# Patient Record
Sex: Female | Born: 1940 | Race: White | Hispanic: No | Marital: Married | Smoking: Former smoker
Health system: Southern US, Community
[De-identification: ages and names within clinical notes are randomized; demographics above are authoritative.]

## PROBLEM LIST (undated history)

## (undated) DIAGNOSIS — N200 Calculus of kidney: Secondary | ICD-10-CM

## (undated) DIAGNOSIS — R7303 Prediabetes: Secondary | ICD-10-CM

## (undated) DIAGNOSIS — I10 Essential (primary) hypertension: Secondary | ICD-10-CM

## (undated) DIAGNOSIS — Z87442 Personal history of urinary calculi: Secondary | ICD-10-CM

---

## 2003-03-21 ENCOUNTER — Ambulatory Visit (HOSPITAL_COMMUNITY): Admission: RE | Admit: 2003-03-21 | Discharge: 2003-03-21 | Payer: Self-pay | Admitting: Internal Medicine

## 2003-03-21 ENCOUNTER — Encounter: Payer: Self-pay | Admitting: Internal Medicine

## 2007-07-16 ENCOUNTER — Emergency Department (HOSPITAL_COMMUNITY): Admission: EM | Admit: 2007-07-16 | Discharge: 2007-07-17 | Payer: Self-pay | Admitting: Emergency Medicine

## 2008-04-14 ENCOUNTER — Emergency Department (HOSPITAL_COMMUNITY): Admission: EM | Admit: 2008-04-14 | Discharge: 2008-04-14 | Payer: Self-pay | Admitting: Emergency Medicine

## 2008-04-19 ENCOUNTER — Ambulatory Visit (HOSPITAL_COMMUNITY): Admission: RE | Admit: 2008-04-19 | Discharge: 2008-04-19 | Payer: Self-pay | Admitting: Urology

## 2009-09-04 ENCOUNTER — Ambulatory Visit (HOSPITAL_COMMUNITY): Admission: RE | Admit: 2009-09-04 | Discharge: 2009-09-04 | Payer: Self-pay | Admitting: Internal Medicine

## 2009-10-02 ENCOUNTER — Telehealth (INDEPENDENT_AMBULATORY_CARE_PROVIDER_SITE_OTHER): Payer: Self-pay

## 2009-10-02 ENCOUNTER — Encounter: Payer: Self-pay | Admitting: Internal Medicine

## 2009-10-09 ENCOUNTER — Ambulatory Visit (HOSPITAL_COMMUNITY): Admission: RE | Admit: 2009-10-09 | Discharge: 2009-10-09 | Payer: Self-pay | Admitting: Internal Medicine

## 2009-10-09 ENCOUNTER — Ambulatory Visit: Payer: Self-pay | Admitting: Internal Medicine

## 2010-08-20 NOTE — Letter (Signed)
Summary: Internal Other Domingo Dimes  Internal Other Domingo Dimes   Imported By: Cloria Spring LPN 54/03/8118 14:78:29  _____________________________________________________________________  External Attachment:    Type:   Image     Comment:   External Document

## 2010-08-20 NOTE — Progress Notes (Signed)
Summary: Reviewed instructions for TCS with pt  Phone Note Call from Patient   Caller: Patient Summary of Call: Pt called and i reviewed instructions for her TCS. Rx and instructions were faxed to [facility] on 10/01/2009. Initial call taken by: Cloria Spring LPN,  October 02, 2009 4:53 PM

## 2011-04-21 LAB — URINALYSIS, ROUTINE W REFLEX MICROSCOPIC
Glucose, UA: NEGATIVE
Leukocytes, UA: NEGATIVE
Nitrite: NEGATIVE
Urobilinogen, UA: 0.2

## 2011-04-21 LAB — POCT I-STAT, CHEM 8
BUN: 18
Calcium, Ion: 1.17
Chloride: 106
Creatinine, Ser: 1
Glucose, Bld: 142 — ABNORMAL HIGH
TCO2: 25

## 2011-04-21 LAB — URINE MICROSCOPIC-ADD ON

## 2011-04-25 LAB — BASIC METABOLIC PANEL
BUN: 10
CO2: 26
Calcium: 9.5
Chloride: 105
Creatinine, Ser: 0.88
Glucose, Bld: 141 — ABNORMAL HIGH

## 2011-04-25 LAB — DIFFERENTIAL
Basophils Absolute: 0
Basophils Relative: 0
Eosinophils Absolute: 0.1
Monocytes Relative: 10
Neutro Abs: 5.6
Neutrophils Relative %: 70

## 2011-04-25 LAB — CBC
MCHC: 33.1
MCV: 86.5
RDW: 13.4

## 2011-05-13 ENCOUNTER — Other Ambulatory Visit: Payer: Self-pay

## 2011-05-13 ENCOUNTER — Emergency Department (HOSPITAL_COMMUNITY)
Admission: EM | Admit: 2011-05-13 | Discharge: 2011-05-13 | Disposition: A | Discharge status: Other Acute Inpatient Hospital | Payer: Medicare Other | Source: Home / Self Care | Attending: Emergency Medicine | Admitting: Emergency Medicine

## 2011-05-13 ENCOUNTER — Emergency Department (HOSPITAL_COMMUNITY): Payer: Medicare Other

## 2011-05-13 ENCOUNTER — Inpatient Hospital Stay (HOSPITAL_COMMUNITY)
Admission: EM | Admit: 2011-05-13 | Discharge: 2011-05-14 | DRG: 313 | Disposition: A | Discharge status: Home Health | Payer: Medicare Other | Source: Other Acute Inpatient Hospital | Attending: Cardiology | Admitting: Cardiology

## 2011-05-13 ENCOUNTER — Encounter: Payer: Self-pay | Admitting: *Deleted

## 2011-05-13 DIAGNOSIS — R0789 Other chest pain: Principal | ICD-10-CM | POA: Diagnosis present

## 2011-05-13 DIAGNOSIS — R079 Chest pain, unspecified: Secondary | ICD-10-CM

## 2011-05-13 DIAGNOSIS — K5730 Diverticulosis of large intestine without perforation or abscess without bleeding: Secondary | ICD-10-CM | POA: Diagnosis present

## 2011-05-13 DIAGNOSIS — Z87891 Personal history of nicotine dependence: Secondary | ICD-10-CM

## 2011-05-13 DIAGNOSIS — E785 Hyperlipidemia, unspecified: Secondary | ICD-10-CM | POA: Diagnosis present

## 2011-05-13 DIAGNOSIS — I200 Unstable angina: Secondary | ICD-10-CM

## 2011-05-13 DIAGNOSIS — Z7982 Long term (current) use of aspirin: Secondary | ICD-10-CM

## 2011-05-13 DIAGNOSIS — M25519 Pain in unspecified shoulder: Secondary | ICD-10-CM | POA: Diagnosis present

## 2011-05-13 DIAGNOSIS — K219 Gastro-esophageal reflux disease without esophagitis: Secondary | ICD-10-CM | POA: Diagnosis present

## 2011-05-13 DIAGNOSIS — J9819 Other pulmonary collapse: Secondary | ICD-10-CM | POA: Diagnosis present

## 2011-05-13 DIAGNOSIS — Z79899 Other long term (current) drug therapy: Secondary | ICD-10-CM

## 2011-05-13 DIAGNOSIS — E7800 Pure hypercholesterolemia, unspecified: Secondary | ICD-10-CM | POA: Diagnosis present

## 2011-05-13 DIAGNOSIS — R7309 Other abnormal glucose: Secondary | ICD-10-CM | POA: Diagnosis present

## 2011-05-13 DIAGNOSIS — I10 Essential (primary) hypertension: Secondary | ICD-10-CM | POA: Diagnosis present

## 2011-05-13 HISTORY — DX: Essential (primary) hypertension: I10

## 2011-05-13 HISTORY — DX: Calculus of kidney: N20.0

## 2011-05-13 LAB — GLUCOSE, CAPILLARY
Glucose-Capillary: 130 mg/dL — ABNORMAL HIGH (ref 70–99)
Glucose-Capillary: 133 mg/dL — ABNORMAL HIGH (ref 70–99)
Glucose-Capillary: 107 mg/dL — ABNORMAL HIGH (ref 70–99)

## 2011-05-13 LAB — CARDIAC PANEL(CRET KIN+CKTOT+MB+TROPI)
CK, MB: 3 ng/mL (ref 0.3–4.0)
CK, MB: 3 ng/mL (ref 0.3–4.0)
Relative Index: 2.5 (ref 0.0–2.5)
Relative Index: 2.7 — ABNORMAL HIGH (ref 0.0–2.5)
Relative Index: 2.2 (ref 0.0–2.5)
Total CK: 118 U/L (ref 7–177)
Total CK: 113 U/L (ref 7–177)
Total CK: 194 U/L — ABNORMAL HIGH (ref 7–177)
Troponin I: <0.3 ng/mL (ref <0.30)
Troponin I: <0.3 ng/mL (ref <0.30)

## 2011-05-13 LAB — LIPID PANEL
Cholesterol: 255 mg/dL — ABNORMAL HIGH (ref 0–200)
VLDL: 17 mg/dL (ref 0–40)

## 2011-05-13 LAB — CBC
Hemoglobin: 13.5 g/dL (ref 12.0–15.0)
Hemoglobin: 12.1 g/dL (ref 12.0–15.0)
MCH: 29.4 pg (ref 26.0–34.0)
MCH: 29.4 pg (ref 26.0–34.0)
MCHC: 34 g/dL (ref 30.0–36.0)
MCHC: 34.7 g/dL (ref 30.0–36.0)
Platelets: 245 10*3/uL (ref 150–400)
Platelets: 213 10*3/uL (ref 150–400)
RBC: 4.59 MIL/uL (ref 3.87–5.11)

## 2011-05-13 LAB — POCT I-STAT, CHEM 8
Creatinine, Ser: 1 mg/dL (ref 0.50–1.10)
Glucose, Bld: 186 mg/dL — ABNORMAL HIGH (ref 70–99)
HCT: 42 % (ref 36.0–46.0)
Hemoglobin: 14.3 g/dL (ref 12.0–15.0)
Potassium: 3.8 mEq/L (ref 3.5–5.1)
Sodium: 133 mEq/L — ABNORMAL LOW (ref 135–145)
TCO2: 24 mmol/L (ref 0–100)

## 2011-05-13 LAB — BASIC METABOLIC PANEL
BUN: 16 mg/dL (ref 6–23)
Chloride: 95 mEq/L — ABNORMAL LOW (ref 96–112)
GFR calc Af Amer: 72 mL/min — ABNORMAL LOW (ref >90)
GFR calc non Af Amer: 62 mL/min — ABNORMAL LOW (ref >90)
Potassium: 3.7 mEq/L (ref 3.5–5.1)
Sodium: 132 mEq/L — ABNORMAL LOW (ref 135–145)

## 2011-05-13 LAB — DIFFERENTIAL
Basophils Relative: 0 % (ref 0–1)
Eosinophils Absolute: 0.1 10*3/uL (ref 0.0–0.7)
Lymphs Abs: 1.3 10*3/uL (ref 0.7–4.0)
Monocytes Relative: 6 % (ref 3–12)
Neutro Abs: 9.2 10*3/uL — ABNORMAL HIGH (ref 1.7–7.7)
Neutrophils Relative %: 82 % — ABNORMAL HIGH (ref 43–77)

## 2011-05-13 LAB — MRSA PCR SCREENING: MRSA by PCR: NEGATIVE

## 2011-05-13 LAB — TSH: TSH: 0.818 u[IU]/mL (ref 0.350–4.500)

## 2011-05-13 LAB — HEMOGLOBIN A1C
Hgb A1c MFr Bld: 6.2 % — ABNORMAL HIGH (ref <5.7)
Mean Plasma Glucose: 131 mg/dL — ABNORMAL HIGH (ref <117)

## 2011-05-13 LAB — POCT I-STAT TROPONIN I: Troponin i, poc: 0 ng/mL (ref 0.00–0.08)

## 2011-05-13 MED ORDER — ONDANSETRON HCL 4 MG/2ML IJ SOLN
4.0000 mg | Freq: Once | INTRAMUSCULAR | Status: AC
  Administered 2011-05-13: 4 mg via INTRAVENOUS
  Filled 2011-05-13: qty 2

## 2011-05-13 MED ORDER — HEPARIN BOLUS VIA INFUSION
5000.0000 [IU] | Freq: Once | INTRAVENOUS | Status: AC
  Administered 2011-05-13: 5000 [IU] via INTRAVENOUS
  Filled 2011-05-13: qty 5000

## 2011-05-13 MED ORDER — ASPIRIN 81 MG PO CHEW
324.0000 mg | CHEWABLE_TABLET | Freq: Once | ORAL | Status: AC
  Administered 2011-05-13: 324 mg via ORAL
  Filled 2011-05-13: qty 4

## 2011-05-13 MED ORDER — SODIUM CHLORIDE 0.9 % IV SOLN
Freq: Once | INTRAVENOUS | Status: AC
  Administered 2011-05-13: 03:00:00 via INTRAVENOUS

## 2011-05-13 MED ORDER — HEPARIN (PORCINE) IN NACL 100-0.45 UNIT/ML-% IJ SOLN
12.0000 [IU]/kg/h | INTRAMUSCULAR | Status: DC
  Administered 2011-05-13: 12 [IU]/kg/h via INTRAVENOUS
  Filled 2011-05-13: qty 250

## 2011-05-13 MED ORDER — NITROGLYCERIN IN D5W 200-5 MCG/ML-% IV SOLN
10.0000 ug/min | Freq: Once | INTRAVENOUS | Status: AC
  Administered 2011-05-13: 10 ug/min via INTRAVENOUS
  Filled 2011-05-13: qty 250

## 2011-05-13 NOTE — H&P (Signed)
[name], [name]               ACCOUNT NO.:  [id number]  MEDICAL RECORD NO.:  [id number]  LOCATION:  2608                         FACILITY:  [facility]  PHYSICIAN:  Zacarias Pontes, MD       DATE OF BIRTH:  04/30/1941  DATE OF ADMISSION:  05/13/2011 DATE OF DISCHARGE:                             HISTORY & PHYSICAL   PRIMARY CARDIOLOGIST:  None established.  CHIEF COMPLAINT:  Chest pain.  HISTORY OF PRESENT ILLNESS:  The patient is a pleasant 70 year old woman with a history of hypertension, hyperlipidemia, and potentially undiagnosed diabetes mellitus who presents with an approximate 12 hour history of central chest tightness, which began at rest while she was applying a heating pad to her sore lower back.  Her primary discomfort beginning yesterday morning was lower back and left shoulder pain, both of which are chronic.  In attempting to obtain some degree of pain relief for her back, she sat on a heating pad and it was during this time that she developed central chest pressure.  The pressure was non-pleurtic and non-radiating.  She had no associated diaphoresis or nausea. No PND or orthopnea.  No cough or overt shortness of breath.  The chest tightness did not improve on its own and as a result, she became concerned and decided to seek medical attention for further evaluation and management.  The patient has not have any known coronary artery disease history and denies having experienced these particular symptoms in the past.  She has, otherwise, felt well of late with no fevers and no sick contacts. No palpitations and no syncope.  PAST MEDICAL HISTORY: 1. Diverticulosis. 2. Nephrolithiasis. 3. Hypertension. 4. Hyperlipidemia. 5. Possible diabetes given her random glucose. 6. GERD.  SOCIAL HISTORY:  She is a current nonsmoker with a very remote smoking history.  She does not drink alcohol, nor does she abuse drugs.  She lives with her husband.  FAMILY HISTORY:   Positive for coronary artery disease in her mother.  REVIEW OF SYSTEMS:  As per HPI, otherwise, is comprehensively negative.  ALLERGIES:  No known drug allergies.  MEDICATIONS: 1. Enalapril 10 mg p.o. b.i.d. 2. Fish oil daily.  PHYSICAL EXAMINATION:  VITAL SIGNS:  The patient has a heart rate of 75 and a blood pressure of 138/86 with a respiratory rate of 16, and she is satting 96% on 2 L nasal cannula. GENERAL:  She is in no acute distress, and is a bit groggy from having been up for most of the night.  She, otherwise is not using any accessory muscles and an assistance of her breathing. HEENT:  Notable for a supple neck with no masses or lymphadenopathy. Her JVP is not appreciably elevated.  CARDIAC:  Notable for a normal S1 and S2 with a 2/6 raspy early systolic ejection murmur, heard best at the right upper sternal border.  No rubs or gallops are appreciated. LUNGS:  Clear to auscultation bilaterally with no crackles or wheezing appreciated. ABDOMEN:  Soft, nontender, nondistended with positive bowel sounds and no abdominal bruits appreciated.  EXTREMITIES:  Warm and well perfused with no lower extremity edema. NEUROLOGIC:  She is alert and oriented x3 with no focal neurologic  deficits detected.  LABORATORY EVALUATION:  White count is 11, her hematocrit is 39, her platelets are 245,000.  Sodium 132, potassium 3.7, chloride 95, bicarb 27, BUN 16, creatinine 0.9, with a random glucose of 182.  Of note, initial set of cardiac enzymes were sent the outside hospital with a CK of 118, CK-MB of 3 and a troponin of less than 0.3.  Her ECG demonstrates normal sinus rhythm, with no more than half a mm of ST depression in leads 2, 3 and aVF.  No ST elevation is present anywhere on the electrocardiogram.  IMPRESSION:  This is a 70 year old woman with a history of hypertension, hyperlipidemia and potential diabetes mellitus who presents with acute onset chest pain, concerning for the  possibility of unstable angina. Alternatively, it is possible that she has referred pain from her chronic lower back discomfort, but we are nevertheless obligated to trend her enzymes and complete on appropriate coronary evaluation.  PLANS:  We will admit her to the step-down unit on IV infusions of heparin and nitroglycerin.  We will titrate her nitroglycerin to a systolic blood pressure goal of approximately 120 and/or total pain relief.  We will continue to trend her cardiac biomarkers over the course of the next 12-18 hours.  She will be on a full dose aspirin, and we will continue her ACE inhibitor for blood pressure control.  Should her ECGs and biomarkers remain reassuring together with the absence of symptoms going forward,  it would then make sense to pursue noninvasive stress testing to evaluate for inducible ischemic regions of myocardium.  If, however, her symptoms and/or ECGs and/or labs evolve in a suggestive manner, we will then plan on more invasive testing via coronary angiogram.  The plan was discussed with the patient who voiced understanding.  Her questions were answered to the best of my ability.          ______________________________ Zacarias Pontes, MD     DM/MEDQ  D:  05/13/2011  T:  05/13/2011  Job:  [id number]  Electronically Signed by Zacarias Pontes MD on 05/13/2011 09:13:24 AM

## 2011-05-13 NOTE — ED Notes (Signed)
Patient c/o nausea; order received for additional Zofran.

## 2011-05-13 NOTE — ED Notes (Signed)
Patient c/o chest pain; states feels like a stuffiness.

## 2011-05-13 NOTE — ED Notes (Signed)
Patient c/o chest pain that started approximately 3 hours ago. Patient states she was having back pain and she was sitting with a heating pad and developed a "congestiony" feeling in her mid-chest.  Patient rates pain as 5/10.  Patient with shortness of breath noted.

## 2011-05-13 NOTE — ED Notes (Signed)
Pt reports onset of substernal chest pain starting approx 3 hs ago, pt reports she took something for acid reflux without relief

## 2011-05-13 NOTE — ED Provider Notes (Signed)
History     CSN: [id number] Arrival date & time: 05/13/2011 12:55 AM   First MD Initiated Contact with Patient 05/13/11 0100      Chief Complaint  Patient presents with  . Chest Pain    (Consider location/radiation/quality/duration/timing/severity/associated sxs/prior treatment) HPI Comments: Is a 70 year old female with a history of hypertension and high cholesterol presents with substernal chest tightness which started approximately 6 hours prior to arrival. She states that for the last 24 hours she has had some discomfort in her left shoulder. This became much worse tonight was associated with shortness of breath and nausea andshe vomited when trying to take her reflux medications. She denies a history of cardiac disease has not smoked in over 40 years and has a family history significant for mother that died of a possible heart attack and a father who died from tuberculosis.  Symptoms are constant, fluctuating in intensity but not related to position, deep breathing, palpation, walking.  Patient is a 70 y.o. female presenting with chest pain. The history is provided by the patient and medical records.  Chest Pain     Past Medical History  Diagnosis Date  . Hypertension   . Kidney calculi     Past Surgical History  Procedure Date  . Cesarean section     No family history on file.  History  Substance Use Topics  . Smoking status: Never Smoker   . Smokeless tobacco: Not on file  . Alcohol Use: No    OB History    Grav Para Term Preterm Abortions TAB SAB Ect Mult Living                  Review of Systems  Cardiovascular: Positive for chest pain.  All other systems reviewed and are negative.    Allergies  Review of patient's allergies indicates no known allergies.  Home Medications   Current Outpatient Rx  Name Route Sig Dispense Refill  . ENALAPRIL MALEATE PO Oral Take by mouth 2 (two) times daily.      . OMEGA-3 FATTY ACIDS 1000 MG PO CAPS Oral Take 2 g  by mouth 2 (two) times daily.        BP 179/101  Pulse 97  Resp 16  Ht 5\' 10"  (1.778 m)  Wt 175 lb (79.379 kg)  BMI 25.11 kg/m2  SpO2 96%  Physical Exam  Nursing note and vitals reviewed. Constitutional: She appears well-developed and well-nourished. No distress.  HENT:  Head: Normocephalic and atraumatic.  Mouth/Throat: Oropharynx is clear and moist. No oropharyngeal exudate.  Eyes: Conjunctivae and EOM are normal. Pupils are equal, round, and reactive to light. Right eye exhibits no discharge. Left eye exhibits no discharge. No scleral icterus.  Neck: Normal range of motion. Neck supple. No JVD present. No thyromegaly present.  Cardiovascular: Normal rate, regular rhythm, normal heart sounds and intact distal pulses.  Exam reveals no gallop and no friction rub.   No murmur heard. Pulmonary/Chest: Effort normal and breath sounds normal. No respiratory distress. She has no wheezes. She has no rales.  Abdominal: Soft. Bowel sounds are normal. She exhibits no distension and no mass. There is no tenderness.  Musculoskeletal: Normal range of motion. She exhibits no edema and no tenderness.  Lymphadenopathy:    She has no cervical adenopathy.  Neurological: She is alert. Coordination normal.  Skin: Skin is warm and dry. No rash noted. No erythema.  Psychiatric: She has a normal mood and affect. Her behavior is normal.  ED Course  Procedures (including critical care time)   Labs Reviewed  CBC  DIFFERENTIAL  BASIC METABOLIC PANEL  CARDIAC PANEL(CRET KIN+CKTOT+MB+TROPI)  I-STAT TROPONIN I  I-STAT, CHEM 8  APTT  PROTIME-INR   No results found.   No diagnosis found.    MDM  Patient has significant hypertension at 179/101, otherwise no tachycardia or respiratory distress. Oxygen levels are 96% on room air. Her EKG does reveal diffuse ST abnormalities with slight depression but no ST elevation. She will require cardiac workup and anticipate admission for a rule out given  her ongoing symptoms. Blood work, chest x-ray ordered.  Initiate nitro drip for ongoing pain    ED ECG REPORT   Date: 05/13/2011   Rate: 100  Rhythm: normal sinus rhythm  QRS Axis: normal  Intervals: normal  ST/T Wave abnormalities: nonspecific ST changes  Conduction Disutrbances:first-degree A-V block   Narrative Interpretation:   Old EKG Reviewed: none available   ED ECG REPORT   Date: 05/13/2011 2:26  Rate:96   Rhythm: normal sinus rhythm  QRS Axis: normal  Intervals: normal  ST/T Wave abnormalities: minimal ST dep   Conduction Disutrbances: first deg AV block  Narrative Interpretation:   Old EKG Reviewed: unchanged  Discussed with cardiologist on call at [facility] hospital who has accepted patient to a step down bed. I started the patient on a nitroglycerin drip as well as a heparin infusion related to her likely unstable angina. Patient is having ongoing chest pain, intermittent nausea but blood pressure has improved on a nitroglycerin drip. Critical care provided  CRITICAL CARE Performed by: Vida Roller   Total critical care time: 35   Critical care time was exclusive of separately billable procedures and treating other patients.  Critical care was necessary to treat or prevent imminent or life-threatening deterioration.  Critical care was time spent personally by me on the following activities: development of treatment plan with patient and/or surrogate as well as nursing, discussions with consultants, evaluation of patient's response to treatment, examination of patient, obtaining history from patient or surrogate, ordering and performing treatments and interventions, ordering and review of laboratory studies, ordering and review of radiographic studies, pulse oximetry and re-evaluation of patient's condition.    Vida Roller, MD 05/13/11 [phone]

## 2011-05-13 NOTE — ED Notes (Signed)
Patient c/o nausea, patient dry heaving into emesis bag.  Dr. Hyacinth Meeker aware, order received for Zofran.

## 2011-05-13 NOTE — ED Notes (Signed)
Patient states her chest pain is coming back.  Continues to have intermittent chest pain that she rates between 5-8/10.

## 2011-05-14 ENCOUNTER — Inpatient Hospital Stay (HOSPITAL_COMMUNITY): Payer: Medicare Other

## 2011-05-14 DIAGNOSIS — R079 Chest pain, unspecified: Secondary | ICD-10-CM

## 2011-05-14 LAB — CBC
HCT: 37.7 % (ref 36.0–46.0)
Hemoglobin: 12.8 g/dL (ref 12.0–15.0)
MCV: 86.9 fL (ref 78.0–100.0)
RBC: 4.34 MIL/uL (ref 3.87–5.11)
RDW: 13.2 % (ref 11.5–15.5)
WBC: 5.9 10*3/uL (ref 4.0–10.5)

## 2011-05-14 MED ORDER — TECHNETIUM TC 99M TETROFOSMIN IV KIT
10.0000 | PACK | Freq: Once | INTRAVENOUS | Status: AC | PRN
  Administered 2011-05-14: 10 via INTRAVENOUS

## 2011-05-14 MED ORDER — TECHNETIUM TC 99M TETROFOSMIN IV KIT
30.0000 | PACK | Freq: Once | INTRAVENOUS | Status: AC | PRN
  Administered 2011-05-14: 30 via INTRAVENOUS

## 2011-05-15 LAB — GLUCOSE, CAPILLARY
Glucose-Capillary: 115 mg/dL — ABNORMAL HIGH (ref 70–99)
Glucose-Capillary: 101 mg/dL — ABNORMAL HIGH (ref 70–99)

## 2011-05-23 NOTE — Discharge Summary (Signed)
[name], LAWYER               ACCOUNT NO.:  [id number]  MEDICAL RECORD NO.:  [id number]  LOCATION:  2008                         FACILITY:  [facility]  PHYSICIAN:  Vesta Mixer, M.D. DATE OF BIRTH:  Dec 12, 1940  DATE OF ADMISSION:  05/13/2011 DATE OF DISCHARGE:  05/14/2011                              DISCHARGE SUMMARY   PRIMARY CARDIOLOGIST:  Vesta Mixer, M.D.  PRIMARY CARE PHYSICIAN:  Madelin Rear. Fusco, MD  DISCHARGE DIAGNOSIS:  Chest pain without objective evidence of ischemia.  SECONDARY DIAGNOSES:  Hyperlipidemia and prediabetes.  ALLERGIES:  No known drug allergies.  PROCEDURES: 1. Chest x-ray revealed clear lungs with right basilar linear     atelectasis and mild calcification of the aortic arch. 2. Lexiscan Myoview stress test reveals small LV chamber with small     areas of decreased subjectively to be artifact.  EF possibly     elevated at 87% due to small LV size.  Otherwise, no diagnostic     abnormalities.  HISTORY OF PRESENT ILLNESS:  [name] is a pleasant 70 year old female with past medical history as mentioned above in the secondary diagnosis who presents with an approximately 12-hour history of central chest tightness.  She experienced this chronic lower back and left shoulder pain, at which time she applied a heating pad to those areas and began to feel mild chest pressure.  The pain is most described as nonpruritic and nonradiating.  The chest tightness has not improved on its own, as a result she became concerned and proceeded to seek medical attention for further evaluation.  She denies diaphoresis, nausea, vomiting, paroxysmal nocturnal dyspnea, orthopnea, cough, or overt shortness of breath.  She denies experiencing these symptoms in the past and has no history of significant coronary artery disease.  Upon arrival to the ED, chest x-ray was done revealing clear lungs with right basilar linear atelectasis and mild calcification of the  aortic arch.  Three sets of cardiac enzymes were drawn and found to be negative, in addition to an ECG done on ED arrival and admission revealing no ischemic changes.  During her admission, an LDL was found to be elevated at 194 and glucose falls in the prediabetic range.  On May 13, 2011, she underwent noninvasive Lexiscan Myoview stress test.  She was admittedly anxious during the procedure and complained of initially experiencing back pain, and nausea otherwise, no other symptoms, and revealed very small LV chamber with small areas of decreased, subjectively to be artifact.  EF was possibly elevated to 87% to his small LV size, otherwise, no diagnostic abnormalities, scars, or evidence of ischemia were found.  Her chest pain resolved throughout her hospital course and she was ruled out for cardiac ischemia causing her chest pain and was cleared for discharge.  DISCHARGE LABS:  Cardiac enzymes negative x3.  Basic metabolic panel; sodium 133, potassium 3.8, chloride 99, CO2 of 27, glucose 186, BUN 17, creatinine 1.  CBC; white blood cell count 5.9, hemoglobin 12.8, hematocrit 37.7, platelets 195.  DISPOSITION:  The patient in stable condition, discharged home.  FOLLOWUP PLANS AND APPOINTMENTS:  The patient is to follow up with Dr. Sherwood Gambler for prediabetes management per discharge  instructions.  Please follow up in 6-8 weeks with [facility] office in [place], [place] for lipid panel and LFTs as Lipitor was started.  DISCHARGE MEDICATIONS: 1. Aspirin 81 mg daily. 2. Lipitor 40 mg daily. 3. Enalapril 10 mg twice daily. 4. Fish oil 3 capsules daily.  OUTSTANDING LABS AND STUDIES:  None.  DURATION OF DISCHARGE ENCOUNTER:  Seen by Dr. Elease Hashimoto and myself for a total of approximately 40 minutes.   Odella Aquas, PA-C ______________________________ Odella Aquas, PA-C   ______________________________ Vesta Mixer, M.D.    RA/MEDQ  D:  05/14/2011  T:  05/15/2011   Job:  [id number]  cc:   Madelin Rear. Sherwood Gambler, MD  Electronically Signed by Odella Aquas PA on 05/22/2011 02:48:00 PM Electronically Signed by Kristeen Miss M.D. on 05/23/2011 03:19:29 PM

## 2011-11-06 ENCOUNTER — Other Ambulatory Visit (HOSPITAL_COMMUNITY): Payer: Self-pay | Admitting: Family Medicine

## 2011-11-06 DIAGNOSIS — Z6826 Body mass index (BMI) 26.0-26.9, adult: Secondary | ICD-10-CM | POA: Diagnosis not present

## 2011-11-06 DIAGNOSIS — I10 Essential (primary) hypertension: Secondary | ICD-10-CM | POA: Diagnosis not present

## 2011-11-06 DIAGNOSIS — Z139 Encounter for screening, unspecified: Secondary | ICD-10-CM

## 2011-11-06 DIAGNOSIS — E785 Hyperlipidemia, unspecified: Secondary | ICD-10-CM | POA: Diagnosis not present

## 2011-11-07 DIAGNOSIS — E785 Hyperlipidemia, unspecified: Secondary | ICD-10-CM | POA: Diagnosis not present

## 2011-11-07 DIAGNOSIS — I10 Essential (primary) hypertension: Secondary | ICD-10-CM | POA: Diagnosis not present

## 2011-11-13 ENCOUNTER — Ambulatory Visit (HOSPITAL_COMMUNITY)
Admission: RE | Admit: 2011-11-13 | Discharge: 2011-11-13 | Disposition: A | Discharge status: Home / Self Care | Payer: Medicare Other | Source: Ambulatory Visit | Attending: Family Medicine | Admitting: Family Medicine

## 2011-11-13 DIAGNOSIS — Z13820 Encounter for screening for osteoporosis: Secondary | ICD-10-CM | POA: Diagnosis not present

## 2011-11-13 DIAGNOSIS — Z139 Encounter for screening, unspecified: Secondary | ICD-10-CM

## 2011-11-13 DIAGNOSIS — Z780 Asymptomatic menopausal state: Secondary | ICD-10-CM | POA: Insufficient documentation

## 2011-11-13 DIAGNOSIS — M949 Disorder of cartilage, unspecified: Secondary | ICD-10-CM | POA: Diagnosis not present

## 2011-11-13 DIAGNOSIS — Z1231 Encounter for screening mammogram for malignant neoplasm of breast: Secondary | ICD-10-CM | POA: Insufficient documentation

## 2011-12-04 DIAGNOSIS — Z6826 Body mass index (BMI) 26.0-26.9, adult: Secondary | ICD-10-CM | POA: Diagnosis not present

## 2011-12-04 DIAGNOSIS — L0390 Cellulitis, unspecified: Secondary | ICD-10-CM | POA: Diagnosis not present

## 2011-12-04 DIAGNOSIS — L0291 Cutaneous abscess, unspecified: Secondary | ICD-10-CM | POA: Diagnosis not present

## 2012-02-12 ENCOUNTER — Other Ambulatory Visit: Payer: Self-pay | Admitting: Physician Assistant

## 2012-02-12 ENCOUNTER — Telehealth (HOSPITAL_COMMUNITY): Payer: Self-pay

## 2012-02-12 NOTE — Telephone Encounter (Signed)
Call patient about her liptor

## 2012-12-15 DIAGNOSIS — Z6826 Body mass index (BMI) 26.0-26.9, adult: Secondary | ICD-10-CM | POA: Diagnosis not present

## 2012-12-15 DIAGNOSIS — R7309 Other abnormal glucose: Secondary | ICD-10-CM | POA: Diagnosis not present

## 2012-12-15 DIAGNOSIS — I10 Essential (primary) hypertension: Secondary | ICD-10-CM | POA: Diagnosis not present

## 2012-12-15 DIAGNOSIS — E785 Hyperlipidemia, unspecified: Secondary | ICD-10-CM | POA: Diagnosis not present

## 2013-11-22 DIAGNOSIS — Z6825 Body mass index (BMI) 25.0-25.9, adult: Secondary | ICD-10-CM | POA: Diagnosis not present

## 2013-11-22 DIAGNOSIS — E785 Hyperlipidemia, unspecified: Secondary | ICD-10-CM | POA: Diagnosis not present

## 2013-11-22 DIAGNOSIS — I10 Essential (primary) hypertension: Secondary | ICD-10-CM | POA: Diagnosis not present

## 2014-01-03 DIAGNOSIS — I10 Essential (primary) hypertension: Secondary | ICD-10-CM | POA: Diagnosis not present

## 2014-01-03 DIAGNOSIS — E785 Hyperlipidemia, unspecified: Secondary | ICD-10-CM | POA: Diagnosis not present

## 2014-01-03 DIAGNOSIS — Z6826 Body mass index (BMI) 26.0-26.9, adult: Secondary | ICD-10-CM | POA: Diagnosis not present

## 2014-01-03 DIAGNOSIS — Z0000 Encounter for general adult medical examination without abnormal findings: Secondary | ICD-10-CM | POA: Diagnosis not present

## 2014-10-31 DIAGNOSIS — E782 Mixed hyperlipidemia: Secondary | ICD-10-CM | POA: Diagnosis not present

## 2014-10-31 DIAGNOSIS — E663 Overweight: Secondary | ICD-10-CM | POA: Diagnosis not present

## 2014-10-31 DIAGNOSIS — Z6825 Body mass index (BMI) 25.0-25.9, adult: Secondary | ICD-10-CM | POA: Diagnosis not present

## 2014-10-31 DIAGNOSIS — R011 Cardiac murmur, unspecified: Secondary | ICD-10-CM | POA: Diagnosis not present

## 2014-10-31 DIAGNOSIS — I10 Essential (primary) hypertension: Secondary | ICD-10-CM | POA: Diagnosis not present

## 2014-11-09 DIAGNOSIS — I10 Essential (primary) hypertension: Secondary | ICD-10-CM | POA: Diagnosis not present

## 2014-11-13 DIAGNOSIS — R011 Cardiac murmur, unspecified: Secondary | ICD-10-CM | POA: Diagnosis not present

## 2014-11-16 DIAGNOSIS — R011 Cardiac murmur, unspecified: Secondary | ICD-10-CM | POA: Diagnosis not present

## 2014-12-11 DIAGNOSIS — E663 Overweight: Secondary | ICD-10-CM | POA: Diagnosis not present

## 2014-12-11 DIAGNOSIS — Z6825 Body mass index (BMI) 25.0-25.9, adult: Secondary | ICD-10-CM | POA: Diagnosis not present

## 2014-12-11 DIAGNOSIS — I10 Essential (primary) hypertension: Secondary | ICD-10-CM | POA: Diagnosis not present

## 2015-01-05 ENCOUNTER — Other Ambulatory Visit (HOSPITAL_COMMUNITY): Payer: Self-pay | Admitting: Family Medicine

## 2015-01-05 DIAGNOSIS — Z1389 Encounter for screening for other disorder: Secondary | ICD-10-CM | POA: Diagnosis not present

## 2015-01-05 DIAGNOSIS — E782 Mixed hyperlipidemia: Secondary | ICD-10-CM | POA: Diagnosis not present

## 2015-01-05 DIAGNOSIS — Z1231 Encounter for screening mammogram for malignant neoplasm of breast: Secondary | ICD-10-CM

## 2015-01-05 DIAGNOSIS — I10 Essential (primary) hypertension: Secondary | ICD-10-CM | POA: Diagnosis not present

## 2015-01-05 DIAGNOSIS — Z0001 Encounter for general adult medical examination with abnormal findings: Secondary | ICD-10-CM | POA: Diagnosis not present

## 2015-01-05 DIAGNOSIS — Z6826 Body mass index (BMI) 26.0-26.9, adult: Secondary | ICD-10-CM | POA: Diagnosis not present

## 2015-01-10 ENCOUNTER — Other Ambulatory Visit (HOSPITAL_COMMUNITY): Payer: Self-pay | Admitting: Family Medicine

## 2015-01-10 DIAGNOSIS — M8580 Other specified disorders of bone density and structure, unspecified site: Secondary | ICD-10-CM

## 2015-01-18 ENCOUNTER — Ambulatory Visit (HOSPITAL_COMMUNITY)
Admission: RE | Admit: 2015-01-18 | Discharge: 2015-01-18 | Disposition: A | Discharge status: Home / Self Care | Payer: Medicare Other | Source: Ambulatory Visit | Attending: Family Medicine | Admitting: Family Medicine

## 2015-01-18 DIAGNOSIS — M8580 Other specified disorders of bone density and structure, unspecified site: Secondary | ICD-10-CM

## 2015-01-18 DIAGNOSIS — Z780 Asymptomatic menopausal state: Secondary | ICD-10-CM | POA: Diagnosis not present

## 2015-01-18 DIAGNOSIS — M8588 Other specified disorders of bone density and structure, other site: Secondary | ICD-10-CM | POA: Diagnosis not present

## 2015-01-18 DIAGNOSIS — Z1231 Encounter for screening mammogram for malignant neoplasm of breast: Secondary | ICD-10-CM | POA: Insufficient documentation

## 2015-01-18 DIAGNOSIS — M8589 Other specified disorders of bone density and structure, multiple sites: Secondary | ICD-10-CM | POA: Diagnosis not present

## 2015-01-23 ENCOUNTER — Other Ambulatory Visit: Payer: Self-pay | Admitting: Family Medicine

## 2015-01-23 DIAGNOSIS — R928 Other abnormal and inconclusive findings on diagnostic imaging of breast: Secondary | ICD-10-CM

## 2015-01-26 ENCOUNTER — Ambulatory Visit
Admission: RE | Admit: 2015-01-26 | Discharge: 2015-01-26 | Disposition: A | Discharge status: Home / Self Care | Payer: Medicare Other | Source: Ambulatory Visit | Attending: Family Medicine | Admitting: Family Medicine

## 2015-01-26 DIAGNOSIS — R928 Other abnormal and inconclusive findings on diagnostic imaging of breast: Secondary | ICD-10-CM | POA: Diagnosis not present

## 2015-07-12 ENCOUNTER — Emergency Department (HOSPITAL_COMMUNITY)
Admission: EM | Admit: 2015-07-12 | Discharge: 2015-07-12 | Disposition: A | Discharge status: Home / Self Care | Payer: Medicare Other | Attending: Emergency Medicine | Admitting: Emergency Medicine

## 2015-07-12 ENCOUNTER — Encounter (HOSPITAL_COMMUNITY): Payer: Self-pay | Admitting: *Deleted

## 2015-07-12 ENCOUNTER — Emergency Department (HOSPITAL_COMMUNITY): Payer: Medicare Other

## 2015-07-12 DIAGNOSIS — Z87442 Personal history of urinary calculi: Secondary | ICD-10-CM | POA: Diagnosis not present

## 2015-07-12 DIAGNOSIS — I10 Essential (primary) hypertension: Secondary | ICD-10-CM | POA: Diagnosis not present

## 2015-07-12 DIAGNOSIS — M7989 Other specified soft tissue disorders: Secondary | ICD-10-CM | POA: Insufficient documentation

## 2015-07-12 DIAGNOSIS — R079 Chest pain, unspecified: Secondary | ICD-10-CM | POA: Insufficient documentation

## 2015-07-12 DIAGNOSIS — Z79899 Other long term (current) drug therapy: Secondary | ICD-10-CM | POA: Insufficient documentation

## 2015-07-12 LAB — CBC WITH DIFFERENTIAL/PLATELET
Basophils Absolute: 0 10*3/uL (ref 0.0–0.1)
Basophils Relative: 1 %
EOS ABS: 0.2 10*3/uL (ref 0.0–0.7)
EOS PCT: 4 %
HCT: 38.7 % (ref 36.0–46.0)
Hemoglobin: 12.6 g/dL (ref 12.0–15.0)
LYMPHS ABS: 1.3 10*3/uL (ref 0.7–4.0)
LYMPHS PCT: 24 %
MCH: 29.8 pg (ref 26.0–34.0)
MCHC: 32.6 g/dL (ref 30.0–36.0)
MCV: 91.5 fL (ref 78.0–100.0)
MONOS PCT: 12 %
Monocytes Absolute: 0.7 10*3/uL (ref 0.1–1.0)
Neutro Abs: 3.3 10*3/uL (ref 1.7–7.7)
Neutrophils Relative %: 59 %
PLATELETS: 207 10*3/uL (ref 150–400)
RBC: 4.23 MIL/uL (ref 3.87–5.11)
RDW: 13 % (ref 11.5–15.5)
WBC: 5.4 10*3/uL (ref 4.0–10.5)

## 2015-07-12 LAB — BASIC METABOLIC PANEL
ANION GAP: 3 — AB (ref 5–15)
BUN: 27 mg/dL — AB (ref 6–20)
CO2: 29 mmol/L (ref 22–32)
Calcium: 10.7 mg/dL — ABNORMAL HIGH (ref 8.9–10.3)
Chloride: 106 mmol/L (ref 101–111)
Creatinine, Ser: 1.36 mg/dL — ABNORMAL HIGH (ref 0.44–1.00)
GFR calc Af Amer: 43 mL/min — ABNORMAL LOW (ref >60)
GFR calc non Af Amer: 37 mL/min — ABNORMAL LOW (ref >60)
GLUCOSE: 115 mg/dL — AB (ref 65–99)
POTASSIUM: 4.4 mmol/L (ref 3.5–5.1)
Sodium: 138 mmol/L (ref 135–145)

## 2015-07-12 LAB — TROPONIN I: Troponin I: <0.03 ng/mL (ref <0.031)

## 2015-07-12 NOTE — ED Notes (Signed)
Pt up in wheelchair to bathroom then to xray

## 2015-07-12 NOTE — ED Notes (Signed)
Pt ambulatory to the bathroom 

## 2015-07-12 NOTE — Discharge Instructions (Signed)
Nonspecific Chest Pain  °Chest pain can be caused by many different conditions. There is always a chance that your pain could be related to something serious, such as a heart attack or a blood clot in your lungs. Chest pain can also be caused by conditions that are not life-threatening. If you have chest pain, it is very important to follow up with your health care provider. °CAUSES  °Chest pain can be caused by: °· Heartburn. °· Pneumonia or bronchitis. °· Anxiety or stress. °· Inflammation around your heart (pericarditis) or lung (pleuritis or pleurisy). °· A blood clot in your lung. °· A collapsed lung (pneumothorax). It can develop suddenly on its own (spontaneous pneumothorax) or from trauma to the chest. °· Shingles infection (varicella-zoster virus). °· Heart attack. °· Damage to the bones, muscles, and cartilage that make up your chest wall. This can include: °¨ Bruised bones due to injury. °¨ Strained muscles or cartilage due to frequent or repeated coughing or overwork. °¨ Fracture to one or more ribs. °¨ Sore cartilage due to inflammation (costochondritis). °RISK FACTORS  °Risk factors for chest pain may include: °· Activities that increase your risk for trauma or injury to your chest. °· Respiratory infections or conditions that cause frequent coughing. °· Medical conditions or overeating that can cause heartburn. °· Heart disease or family history of heart disease. °· Conditions or health behaviors that increase your risk of developing a blood clot. °· Having had chicken pox (varicella zoster). °SIGNS AND SYMPTOMS °Chest pain can feel like: °· Burning or tingling on the surface of your chest or deep in your chest. °· Crushing, pressure, aching, or squeezing pain. °· Dull or sharp pain that is worse when you move, cough, or take a deep breath. °· Pain that is also felt in your back, neck, shoulder, or arm, or pain that spreads to any of these areas. °Your chest pain may come and go, or it may stay  constant. °DIAGNOSIS °Lab tests or other studies may be needed to find the cause of your pain. Your health care provider may have you take a test called an ambulatory ECG (electrocardiogram). An ECG records your heartbeat patterns at the time the test is performed. You may also have other tests, such as: °· Transthoracic echocardiogram (TTE). During echocardiography, sound waves are used to create a picture of all of the heart structures and to look at how blood flows through your heart. °· Transesophageal echocardiogram (TEE). This is a more advanced imaging test that obtains images from inside your body. It allows your health care provider to see your heart in finer detail. °· Cardiac monitoring. This allows your health care provider to monitor your heart rate and rhythm in real time. °· Holter monitor. This is a portable device that records your heartbeat and can help to diagnose abnormal heartbeats. It allows your health care provider to track your heart activity for several days, if needed. °· Stress tests. These can be done through exercise or by taking medicine that makes your heart beat more quickly. °· Blood tests. °· Imaging tests. °TREATMENT  °Your treatment depends on what is causing your chest pain. Treatment may include: °· Medicines. These may include: °¨ Acid blockers for heartburn. °¨ Anti-inflammatory medicine. °¨ Pain medicine for inflammatory conditions. °¨ Antibiotic medicine, if an infection is present. °¨ Medicines to dissolve blood clots. °¨ Medicines to treat coronary artery disease. °· Supportive care for conditions that do not require medicines. This may include: °¨ Resting. °¨ Applying heat   or cold packs to injured areas. °¨ Limiting activities until pain decreases. °HOME CARE INSTRUCTIONS °· If you were prescribed an antibiotic medicine, finish it all even if you start to feel better. °· Avoid any activities that bring on chest pain. °· Do not use any tobacco products, including  cigarettes, chewing tobacco, or electronic cigarettes. If you need help quitting, ask your health care provider. °· Do not drink alcohol. °· Take medicines only as directed by your health care provider. °· Keep all follow-up visits as directed by your health care provider. This is important. This includes any further testing if your chest pain does not go away. °· If heartburn is the cause for your chest pain, you may be told to keep your head raised (elevated) while sleeping. This reduces the chance that acid will go from your stomach into your esophagus. °· Make lifestyle changes as directed by your health care provider. These may include: °¨ Getting regular exercise. Ask your health care provider to suggest some activities that are safe for you. °¨ Eating a heart-healthy diet. A registered dietitian can help you to learn healthy eating options. °¨ Maintaining a healthy weight. °¨ Managing diabetes, if necessary. °¨ Reducing stress. °SEEK MEDICAL CARE IF: °· Your chest pain does not go away after treatment. °· You have a rash with blisters on your chest. °· You have a fever. °SEEK IMMEDIATE MEDICAL CARE IF:  °· Your chest pain is worse. °· You have an increasing cough, or you cough up blood. °· You have severe abdominal pain. °· You have severe weakness. °· You faint. °· You have chills. °· You have sudden, unexplained chest discomfort. °· You have sudden, unexplained discomfort in your arms, back, neck, or jaw. °· You have shortness of breath at any time. °· You suddenly start to sweat, or your skin gets clammy. °· You feel nauseous or you vomit. °· You suddenly feel light-headed or dizzy. °· Your heart begins to beat quickly, or it feels like it is skipping beats. °These symptoms may represent a serious problem that is an emergency. Do not wait to see if the symptoms will go away. Get medical help right away. Call your local emergency services (911 in the [place] Do not drive yourself to the hospital. °  °This  information is not intended to replace advice given to you by your health care provider. Make sure you discuss any questions you have with your health care provider. °  °Document Released: 04/16/2005 Document Revised: 07/28/2014 Document Reviewed: 02/10/2014 °[employer]vier Interactive Patient Education ©2016 [employer] ° °

## 2015-07-12 NOTE — ED Provider Notes (Signed)
CSN: [id number]     Arrival date & time 07/12/15  1234 History   First MD Initiated Contact with Patient 07/12/15 1255     Chief Complaint  Patient presents with  . Chest Pain     (Consider location/radiation/quality/duration/timing/severity/associated sxs/prior Treatment) Patient is a 74 y.o. female presenting with chest pain.  Chest Pain Associated symptoms: no abdominal pain, no back pain, no headache, no nausea, no numbness, no shortness of breath, not vomiting and no weakness    patient presents with chest pain. She has occasional dull chest pain that will come and go. It will not come on with exertion in fact he seemed to get better with exercise. It lasts under treatment. Does not come on with exercise. No shortness of breath with the episodes was states she does feel somewhat short of breath with exertion. States this is not a really changed. No weight gain. She had had a decrease couple months ago and her metoprolol to do leg swelling. No worsening of her leg swelling states is improving off medicine. No bleeding.  Past Medical History  Diagnosis Date  . Hypertension   . Kidney calculi    Past Surgical History  Procedure Laterality Date  . Cesarean section     No family history on file. Social History  Substance Use Topics  . Smoking status: Never Smoker   . Smokeless tobacco: None  . Alcohol Use: No   OB History    No data available     Review of Systems  Constitutional: Negative for activity change and appetite change.  Eyes: Negative for pain.  Respiratory: Negative for chest tightness and shortness of breath.   Cardiovascular: Positive for chest pain. Negative for leg swelling.  Gastrointestinal: Negative for nausea, vomiting, abdominal pain and diarrhea.  Genitourinary: Negative for flank pain.  Musculoskeletal: Negative for back pain and neck stiffness.  Skin: Negative for rash.  Neurological: Negative for weakness, numbness and headaches.   Psychiatric/Behavioral: Negative for behavioral problems.      Allergies  Review of patient's allergies indicates no known allergies.  Home Medications   Prior to Admission medications   Medication Sig Start Date End Date Taking? Authorizing Provider  atorvastatin (LIPITOR) 10 MG tablet Take 10 mg by mouth.   Yes Historical Provider, MD  Calcium Carbonate-Vit D-Min (CALCIUM 1200 PO) Take 1,200 mg by mouth daily.   Yes Historical Provider, MD  fish oil-omega-3 fatty acids 1000 MG capsule Take 2 g by mouth 2 (two) times daily.     Yes Historical Provider, MD  ibuprofen (ADVIL,MOTRIN) 400 MG tablet Take 400 mg by mouth every 6 (six) hours as needed.   Yes Historical Provider, MD  losartan-hydrochlorothiazide (HYZAAR) 100-25 MG tablet Take 1 tablet by mouth daily. 06/17/15  Yes Historical Provider, MD  Magnesium Oxide 400 MG CAPS Take 400 mg by mouth daily.   Yes Historical Provider, MD  metoprolol (LOPRESSOR) 100 MG tablet Take 50 mg by mouth.   Yes Historical Provider, MD  spironolactone (ALDACTONE) 50 MG tablet Take 50 mg by mouth daily. 06/30/15  Yes Historical Provider, MD  ENALAPRIL MALEATE PO Take by mouth 2 (two) times daily. Reported on 07/12/2015    Historical Provider, MD   BP 143/96 mmHg  Pulse 59  Temp(Src) 98.3 F (36.8 C) (Oral)  Resp 17  Ht  (1.778 m)  Wt 183 lb (83.008 kg)  BMI 26.26 kg/m2  SpO2 96% Physical Exam  Constitutional: She is oriented to person, place, and time.  She appears well-developed and well-nourished.  HENT:  Head: Normocephalic and atraumatic.  Eyes: Pupils are equal, round, and reactive to light.  Neck: Normal range of motion. Neck supple.  Cardiovascular: Normal rate, regular rhythm and normal heart sounds.   No murmur heard. Pulmonary/Chest: Effort normal and breath sounds normal. No respiratory distress. She has no wheezes. She has no rales.  Abdominal: Soft. Bowel sounds are normal. She exhibits no distension. There is no  tenderness.  Musculoskeletal: Normal range of motion.  Neurological: She is alert and oriented to person, place, and time.  Skin: Skin is warm and dry.  Psychiatric: She has a normal mood and affect. Her speech is normal.  Nursing note and vitals reviewed.   ED Course  Procedures (including critical care time) Labs Review Labs Reviewed  BASIC METABOLIC PANEL - Abnormal; Notable for the following:    Glucose, Bld 115 (*)    BUN 27 (*)    Creatinine, Ser 1.36 (*)    Calcium 10.7 (*)    GFR calc non Af Amer 37 (*)    GFR calc Af Amer 43 (*)    Anion gap 3 (*)    All other components within normal limits  CBC WITH DIFFERENTIAL/PLATELET  TROPONIN I  TROPONIN I    Imaging Review Dg Chest 2 View  07/12/2015  CLINICAL DATA:  Intermittent left chest pain x 2 days and became worse this morning. Pain described as aching and tightness at times when it occurs. Sob that has become worse today. Hx htn EXAM: CHEST  2 VIEW COMPARISON:  None. FINDINGS: Cardiac silhouette is normal in size and configuration. There are no mediastinal or hilar masses or evidence of adenopathy. The aorta is uncoiled. Lungs are hyperexpanded but clear. No pleural effusion or pneumothorax. Bony thorax is demineralized but grossly intact. IMPRESSION: 1. No acute cardiopulmonary disease. Electronically Signed   By: Amie Portlandavid  Ormond M.D.   On: 07/12/2015 13:48   I have personally reviewed and evaluated these images and lab results as part of my medical decision-making.   EKG Interpretation   Date/Time:  Thursday July 12 2015 12:45:53 EST Ventricular Rate:  67 PR Interval:  224 QRS Duration: 89 QT Interval:  375 QTC Calculation: 396 R Axis:   64 Text Interpretation:  Sinus rhythm Ventricular premature complex Prolonged  PR interval Abnormal R-wave progression, early transition Minimal ST  depression, inferior leads stable from prior Confirmed by Rubin PayorPICKERING  MD,  Harrold DonathNATHAN [phone] on 07/12/2015 1:08:09 PM      MDM    Final diagnoses:  Chest pain, unspecified chest pain type    Patient with chest pain. EKG stable from prior. The negative 2. Will discharge home.    Benjiman CoreNathan Neaveh Belanger, MD 07/12/15 2141

## 2015-07-12 NOTE — ED Notes (Signed)
Patient given discharge instruction, verbalized understand. IV removed, band aid applied. Patient ambulatory out of the department.  

## 2015-07-12 NOTE — ED Notes (Signed)
Intermittent left chest pain x 2 days and  became worse this morning. Pain described as aching and tightness at times when it occurs. Nothing makes pain worse or better. Pain at 1 at present and a 7-8 at its worst. States sob with activity which she states is more noticeable recently

## 2015-11-16 DIAGNOSIS — E782 Mixed hyperlipidemia: Secondary | ICD-10-CM | POA: Diagnosis not present

## 2015-11-16 DIAGNOSIS — R7309 Other abnormal glucose: Secondary | ICD-10-CM | POA: Diagnosis not present

## 2015-11-16 DIAGNOSIS — Z1389 Encounter for screening for other disorder: Secondary | ICD-10-CM | POA: Diagnosis not present

## 2015-11-16 DIAGNOSIS — R031 Nonspecific low blood-pressure reading: Secondary | ICD-10-CM | POA: Diagnosis not present

## 2015-11-16 DIAGNOSIS — I10 Essential (primary) hypertension: Secondary | ICD-10-CM | POA: Diagnosis not present

## 2015-11-16 DIAGNOSIS — Z6827 Body mass index (BMI) 27.0-27.9, adult: Secondary | ICD-10-CM | POA: Diagnosis not present

## 2015-11-19 DIAGNOSIS — E782 Mixed hyperlipidemia: Secondary | ICD-10-CM | POA: Diagnosis not present

## 2015-11-19 DIAGNOSIS — Z1389 Encounter for screening for other disorder: Secondary | ICD-10-CM | POA: Diagnosis not present

## 2015-11-19 DIAGNOSIS — R031 Nonspecific low blood-pressure reading: Secondary | ICD-10-CM | POA: Diagnosis not present

## 2015-11-19 DIAGNOSIS — I10 Essential (primary) hypertension: Secondary | ICD-10-CM | POA: Diagnosis not present

## 2015-12-04 DIAGNOSIS — R944 Abnormal results of kidney function studies: Secondary | ICD-10-CM | POA: Diagnosis not present

## 2016-01-11 DIAGNOSIS — Z6827 Body mass index (BMI) 27.0-27.9, adult: Secondary | ICD-10-CM | POA: Diagnosis not present

## 2016-01-11 DIAGNOSIS — Z0000 Encounter for general adult medical examination without abnormal findings: Secondary | ICD-10-CM | POA: Diagnosis not present

## 2016-01-11 DIAGNOSIS — E663 Overweight: Secondary | ICD-10-CM | POA: Diagnosis not present

## 2016-02-06 DIAGNOSIS — I10 Essential (primary) hypertension: Secondary | ICD-10-CM | POA: Diagnosis not present

## 2016-02-06 DIAGNOSIS — N183 Chronic kidney disease, stage 3 (moderate): Secondary | ICD-10-CM | POA: Diagnosis not present

## 2016-02-15 ENCOUNTER — Other Ambulatory Visit (HOSPITAL_COMMUNITY): Payer: Self-pay | Admitting: Nephrology

## 2016-02-15 DIAGNOSIS — N183 Chronic kidney disease, stage 3 unspecified: Secondary | ICD-10-CM

## 2016-02-22 DIAGNOSIS — Z1159 Encounter for screening for other viral diseases: Secondary | ICD-10-CM | POA: Diagnosis not present

## 2016-02-22 DIAGNOSIS — I10 Essential (primary) hypertension: Secondary | ICD-10-CM | POA: Diagnosis not present

## 2016-02-22 DIAGNOSIS — Z79899 Other long term (current) drug therapy: Secondary | ICD-10-CM | POA: Diagnosis not present

## 2016-02-22 DIAGNOSIS — N184 Chronic kidney disease, stage 4 (severe): Secondary | ICD-10-CM | POA: Diagnosis not present

## 2016-02-22 DIAGNOSIS — R809 Proteinuria, unspecified: Secondary | ICD-10-CM | POA: Diagnosis not present

## 2016-02-22 DIAGNOSIS — E559 Vitamin D deficiency, unspecified: Secondary | ICD-10-CM | POA: Diagnosis not present

## 2016-02-22 DIAGNOSIS — D509 Iron deficiency anemia, unspecified: Secondary | ICD-10-CM | POA: Diagnosis not present

## 2016-03-04 ENCOUNTER — Ambulatory Visit (HOSPITAL_COMMUNITY)
Admission: RE | Admit: 2016-03-04 | Discharge: 2016-03-04 | Disposition: A | Discharge status: Home / Self Care | Payer: Medicare Other | Source: Ambulatory Visit | Attending: Nephrology | Admitting: Nephrology

## 2016-03-04 ENCOUNTER — Other Ambulatory Visit (HOSPITAL_COMMUNITY): Payer: Self-pay | Admitting: Nephrology

## 2016-03-04 DIAGNOSIS — K8020 Calculus of gallbladder without cholecystitis without obstruction: Secondary | ICD-10-CM | POA: Insufficient documentation

## 2016-03-04 DIAGNOSIS — R590 Localized enlarged lymph nodes: Secondary | ICD-10-CM | POA: Diagnosis not present

## 2016-03-04 DIAGNOSIS — N183 Chronic kidney disease, stage 3 unspecified: Secondary | ICD-10-CM

## 2016-03-04 DIAGNOSIS — N281 Cyst of kidney, acquired: Secondary | ICD-10-CM | POA: Diagnosis not present

## 2016-03-04 DIAGNOSIS — Z87891 Personal history of nicotine dependence: Secondary | ICD-10-CM | POA: Diagnosis not present

## 2016-03-19 DIAGNOSIS — I10 Essential (primary) hypertension: Secondary | ICD-10-CM | POA: Diagnosis not present

## 2016-03-19 DIAGNOSIS — N183 Chronic kidney disease, stage 3 (moderate): Secondary | ICD-10-CM | POA: Diagnosis not present

## 2016-07-17 DIAGNOSIS — I10 Essential (primary) hypertension: Secondary | ICD-10-CM | POA: Diagnosis not present

## 2016-07-17 DIAGNOSIS — Z79899 Other long term (current) drug therapy: Secondary | ICD-10-CM | POA: Diagnosis not present

## 2016-07-17 DIAGNOSIS — N183 Chronic kidney disease, stage 3 (moderate): Secondary | ICD-10-CM | POA: Diagnosis not present

## 2016-07-17 DIAGNOSIS — E559 Vitamin D deficiency, unspecified: Secondary | ICD-10-CM | POA: Diagnosis not present

## 2016-07-17 DIAGNOSIS — D509 Iron deficiency anemia, unspecified: Secondary | ICD-10-CM | POA: Diagnosis not present

## 2016-07-17 DIAGNOSIS — R809 Proteinuria, unspecified: Secondary | ICD-10-CM | POA: Diagnosis not present

## 2016-09-08 DIAGNOSIS — R809 Proteinuria, unspecified: Secondary | ICD-10-CM | POA: Diagnosis not present

## 2016-09-08 DIAGNOSIS — N183 Chronic kidney disease, stage 3 (moderate): Secondary | ICD-10-CM | POA: Diagnosis not present

## 2016-09-08 DIAGNOSIS — E870 Hyperosmolality and hypernatremia: Secondary | ICD-10-CM | POA: Diagnosis not present

## 2016-11-20 DIAGNOSIS — E782 Mixed hyperlipidemia: Secondary | ICD-10-CM | POA: Diagnosis not present

## 2016-11-20 DIAGNOSIS — Z6824 Body mass index (BMI) 24.0-24.9, adult: Secondary | ICD-10-CM | POA: Diagnosis not present

## 2016-11-20 DIAGNOSIS — I10 Essential (primary) hypertension: Secondary | ICD-10-CM | POA: Diagnosis not present

## 2016-11-20 DIAGNOSIS — N183 Chronic kidney disease, stage 3 (moderate): Secondary | ICD-10-CM | POA: Diagnosis not present

## 2016-11-20 DIAGNOSIS — Z1389 Encounter for screening for other disorder: Secondary | ICD-10-CM | POA: Diagnosis not present

## 2016-11-24 DIAGNOSIS — E782 Mixed hyperlipidemia: Secondary | ICD-10-CM | POA: Diagnosis not present

## 2016-11-24 DIAGNOSIS — N182 Chronic kidney disease, stage 2 (mild): Secondary | ICD-10-CM | POA: Diagnosis not present

## 2016-11-24 DIAGNOSIS — Z1389 Encounter for screening for other disorder: Secondary | ICD-10-CM | POA: Diagnosis not present

## 2016-11-24 DIAGNOSIS — I10 Essential (primary) hypertension: Secondary | ICD-10-CM | POA: Diagnosis not present

## 2017-02-05 DIAGNOSIS — R809 Proteinuria, unspecified: Secondary | ICD-10-CM | POA: Diagnosis not present

## 2017-02-05 DIAGNOSIS — D509 Iron deficiency anemia, unspecified: Secondary | ICD-10-CM | POA: Diagnosis not present

## 2017-02-05 DIAGNOSIS — I10 Essential (primary) hypertension: Secondary | ICD-10-CM | POA: Diagnosis not present

## 2017-02-05 DIAGNOSIS — Z79899 Other long term (current) drug therapy: Secondary | ICD-10-CM | POA: Diagnosis not present

## 2017-02-05 DIAGNOSIS — E559 Vitamin D deficiency, unspecified: Secondary | ICD-10-CM | POA: Diagnosis not present

## 2017-02-05 DIAGNOSIS — N183 Chronic kidney disease, stage 3 (moderate): Secondary | ICD-10-CM | POA: Diagnosis not present

## 2017-02-11 DIAGNOSIS — N250 Renal osteodystrophy: Secondary | ICD-10-CM | POA: Diagnosis not present

## 2017-02-11 DIAGNOSIS — R809 Proteinuria, unspecified: Secondary | ICD-10-CM | POA: Diagnosis not present

## 2017-02-11 DIAGNOSIS — N183 Chronic kidney disease, stage 3 (moderate): Secondary | ICD-10-CM | POA: Diagnosis not present

## 2017-02-11 DIAGNOSIS — I10 Essential (primary) hypertension: Secondary | ICD-10-CM | POA: Diagnosis not present

## 2017-10-05 DIAGNOSIS — D509 Iron deficiency anemia, unspecified: Secondary | ICD-10-CM | POA: Diagnosis not present

## 2017-10-05 DIAGNOSIS — R809 Proteinuria, unspecified: Secondary | ICD-10-CM | POA: Diagnosis not present

## 2017-10-05 DIAGNOSIS — Z79899 Other long term (current) drug therapy: Secondary | ICD-10-CM | POA: Diagnosis not present

## 2017-10-05 DIAGNOSIS — I10 Essential (primary) hypertension: Secondary | ICD-10-CM | POA: Diagnosis not present

## 2017-10-05 DIAGNOSIS — N183 Chronic kidney disease, stage 3 (moderate): Secondary | ICD-10-CM | POA: Diagnosis not present

## 2017-10-05 DIAGNOSIS — E559 Vitamin D deficiency, unspecified: Secondary | ICD-10-CM | POA: Diagnosis not present

## 2017-10-07 ENCOUNTER — Other Ambulatory Visit (HOSPITAL_COMMUNITY): Payer: Self-pay | Admitting: Physician Assistant

## 2017-10-07 ENCOUNTER — Ambulatory Visit (HOSPITAL_COMMUNITY)
Admission: RE | Admit: 2017-10-07 | Discharge: 2017-10-07 | Disposition: A | Discharge status: Home / Self Care | Payer: Medicare Other | Source: Ambulatory Visit | Attending: Physician Assistant | Admitting: Physician Assistant

## 2017-10-07 DIAGNOSIS — Z6824 Body mass index (BMI) 24.0-24.9, adult: Secondary | ICD-10-CM | POA: Diagnosis not present

## 2017-10-07 DIAGNOSIS — N183 Chronic kidney disease, stage 3 (moderate): Secondary | ICD-10-CM | POA: Diagnosis not present

## 2017-10-07 DIAGNOSIS — I10 Essential (primary) hypertension: Secondary | ICD-10-CM | POA: Diagnosis not present

## 2017-10-07 DIAGNOSIS — E782 Mixed hyperlipidemia: Secondary | ICD-10-CM | POA: Diagnosis not present

## 2017-10-07 DIAGNOSIS — R918 Other nonspecific abnormal finding of lung field: Secondary | ICD-10-CM | POA: Diagnosis not present

## 2017-10-07 DIAGNOSIS — R079 Chest pain, unspecified: Secondary | ICD-10-CM

## 2017-10-07 DIAGNOSIS — R7309 Other abnormal glucose: Secondary | ICD-10-CM | POA: Diagnosis not present

## 2017-10-07 DIAGNOSIS — Z0001 Encounter for general adult medical examination with abnormal findings: Secondary | ICD-10-CM | POA: Diagnosis not present

## 2017-10-07 DIAGNOSIS — Z0000 Encounter for general adult medical examination without abnormal findings: Secondary | ICD-10-CM | POA: Diagnosis not present

## 2017-10-07 DIAGNOSIS — Z1389 Encounter for screening for other disorder: Secondary | ICD-10-CM | POA: Diagnosis not present

## 2017-10-08 DIAGNOSIS — N183 Chronic kidney disease, stage 3 (moderate): Secondary | ICD-10-CM | POA: Diagnosis not present

## 2017-10-08 DIAGNOSIS — N2581 Secondary hyperparathyroidism of renal origin: Secondary | ICD-10-CM | POA: Diagnosis not present

## 2017-10-08 DIAGNOSIS — E559 Vitamin D deficiency, unspecified: Secondary | ICD-10-CM | POA: Diagnosis not present

## 2018-02-01 DIAGNOSIS — E559 Vitamin D deficiency, unspecified: Secondary | ICD-10-CM | POA: Diagnosis not present

## 2018-02-01 DIAGNOSIS — D509 Iron deficiency anemia, unspecified: Secondary | ICD-10-CM | POA: Diagnosis not present

## 2018-02-01 DIAGNOSIS — I10 Essential (primary) hypertension: Secondary | ICD-10-CM | POA: Diagnosis not present

## 2018-02-01 DIAGNOSIS — R809 Proteinuria, unspecified: Secondary | ICD-10-CM | POA: Diagnosis not present

## 2018-02-01 DIAGNOSIS — N183 Chronic kidney disease, stage 3 (moderate): Secondary | ICD-10-CM | POA: Diagnosis not present

## 2018-02-01 DIAGNOSIS — Z79899 Other long term (current) drug therapy: Secondary | ICD-10-CM | POA: Diagnosis not present

## 2018-02-03 DIAGNOSIS — N183 Chronic kidney disease, stage 3 (moderate): Secondary | ICD-10-CM | POA: Diagnosis not present

## 2018-02-03 DIAGNOSIS — I10 Essential (primary) hypertension: Secondary | ICD-10-CM | POA: Diagnosis not present

## 2018-02-03 DIAGNOSIS — M9080 Osteopathy in diseases classified elsewhere, unspecified site: Secondary | ICD-10-CM | POA: Diagnosis not present

## 2018-02-03 DIAGNOSIS — E889 Metabolic disorder, unspecified: Secondary | ICD-10-CM | POA: Diagnosis not present

## 2018-08-23 DIAGNOSIS — R809 Proteinuria, unspecified: Secondary | ICD-10-CM | POA: Diagnosis not present

## 2018-08-23 DIAGNOSIS — D649 Anemia, unspecified: Secondary | ICD-10-CM | POA: Diagnosis not present

## 2018-08-23 DIAGNOSIS — Z79899 Other long term (current) drug therapy: Secondary | ICD-10-CM | POA: Diagnosis not present

## 2018-08-23 DIAGNOSIS — E559 Vitamin D deficiency, unspecified: Secondary | ICD-10-CM | POA: Diagnosis not present

## 2018-08-23 DIAGNOSIS — I10 Essential (primary) hypertension: Secondary | ICD-10-CM | POA: Diagnosis not present

## 2018-08-23 DIAGNOSIS — N183 Chronic kidney disease, stage 3 (moderate): Secondary | ICD-10-CM | POA: Diagnosis not present

## 2018-08-25 DIAGNOSIS — N183 Chronic kidney disease, stage 3 (moderate): Secondary | ICD-10-CM | POA: Diagnosis not present

## 2018-08-25 DIAGNOSIS — D649 Anemia, unspecified: Secondary | ICD-10-CM | POA: Diagnosis not present

## 2018-08-25 DIAGNOSIS — I10 Essential (primary) hypertension: Secondary | ICD-10-CM | POA: Diagnosis not present

## 2018-09-09 ENCOUNTER — Other Ambulatory Visit: Payer: Self-pay

## 2018-09-09 ENCOUNTER — Encounter (HOSPITAL_COMMUNITY): Payer: Self-pay | Admitting: Emergency Medicine

## 2018-09-09 ENCOUNTER — Emergency Department (HOSPITAL_COMMUNITY)
Admission: EM | Admit: 2018-09-09 | Discharge: 2018-09-09 | Disposition: A | Discharge status: Home / Self Care | Payer: Medicare Other | Attending: Emergency Medicine | Admitting: Emergency Medicine

## 2018-09-09 DIAGNOSIS — I160 Hypertensive urgency: Secondary | ICD-10-CM | POA: Diagnosis not present

## 2018-09-09 DIAGNOSIS — I10 Essential (primary) hypertension: Secondary | ICD-10-CM | POA: Diagnosis present

## 2018-09-09 DIAGNOSIS — Z79899 Other long term (current) drug therapy: Secondary | ICD-10-CM | POA: Diagnosis not present

## 2018-09-09 DIAGNOSIS — I161 Hypertensive emergency: Secondary | ICD-10-CM | POA: Diagnosis not present

## 2018-09-09 LAB — CBC WITH DIFFERENTIAL/PLATELET
Abs Immature Granulocytes: 0.02 10*3/uL (ref 0.00–0.07)
BASOS PCT: 1 %
Basophils Absolute: 0 10*3/uL (ref 0.0–0.1)
Eosinophils Absolute: 0.2 10*3/uL (ref 0.0–0.5)
Eosinophils Relative: 3 %
HCT: 45.3 % (ref 36.0–46.0)
Hemoglobin: 13.7 g/dL (ref 12.0–15.0)
Immature Granulocytes: 0 %
Lymphocytes Relative: 16 %
Lymphs Abs: 1 10*3/uL (ref 0.7–4.0)
MCH: 28.2 pg (ref 26.0–34.0)
MCHC: 30.2 g/dL (ref 30.0–36.0)
MCV: 93.2 fL (ref 80.0–100.0)
MONO ABS: 0.4 10*3/uL (ref 0.1–1.0)
Monocytes Relative: 7 %
NEUTROS ABS: 4.2 10*3/uL (ref 1.7–7.7)
Neutrophils Relative %: 73 %
Platelets: 195 10*3/uL (ref 150–400)
RBC: 4.86 MIL/uL (ref 3.87–5.11)
RDW: 13.1 % (ref 11.5–15.5)
WBC: 5.8 10*3/uL (ref 4.0–10.5)
nRBC: 0 % (ref 0.0–0.2)

## 2018-09-09 LAB — COMPREHENSIVE METABOLIC PANEL
ALT: 16 U/L (ref 0–44)
AST: 19 U/L (ref 15–41)
Albumin: 4.3 g/dL (ref 3.5–5.0)
Alkaline Phosphatase: 66 U/L (ref 38–126)
Anion gap: 7 (ref 5–15)
BUN: 19 mg/dL (ref 8–23)
CO2: 26 mmol/L (ref 22–32)
Calcium: 10.1 mg/dL (ref 8.9–10.3)
Chloride: 107 mmol/L (ref 98–111)
Creatinine, Ser: 0.78 mg/dL (ref 0.44–1.00)
GFR calc Af Amer: >60 mL/min (ref >60)
GFR calc non Af Amer: >60 mL/min (ref >60)
Glucose, Bld: 109 mg/dL — ABNORMAL HIGH (ref 70–99)
POTASSIUM: 4.4 mmol/L (ref 3.5–5.1)
Sodium: 140 mmol/L (ref 135–145)
Total Bilirubin: 1.6 mg/dL — ABNORMAL HIGH (ref 0.3–1.2)
Total Protein: 7.1 g/dL (ref 6.5–8.1)

## 2018-09-09 LAB — URINALYSIS, ROUTINE W REFLEX MICROSCOPIC
Bilirubin Urine: NEGATIVE
Glucose, UA: NEGATIVE mg/dL
Ketones, ur: NEGATIVE mg/dL
Leukocytes,Ua: NEGATIVE
Nitrite: NEGATIVE
Protein, ur: NEGATIVE mg/dL
SPECIFIC GRAVITY, URINE: 1.009 (ref 1.005–1.030)
pH: 8 (ref 5.0–8.0)

## 2018-09-09 MED ORDER — HYDRALAZINE HCL 20 MG/ML IJ SOLN
10.0000 mg | Freq: Once | INTRAMUSCULAR | Status: AC
  Administered 2018-09-09: 10 mg via INTRAVENOUS
  Filled 2018-09-09: qty 1

## 2018-09-09 MED ORDER — HYDRALAZINE HCL 10 MG PO TABS: 10.0000 mg | ORAL_TABLET | Freq: Three times a day (TID) | ORAL | 0 refills | Status: DC

## 2018-09-09 NOTE — ED Notes (Signed)
Pt discharge by Saul Fordyce RN

## 2018-09-09 NOTE — ED Triage Notes (Signed)
Patient was sent for evaluation of hypertension by PCP. BP in their office was 210 systolic. Patient has no complaints of HA or weakness.

## 2018-09-09 NOTE — Discharge Instructions (Addendum)
As discussed, your evaluation today has been largely reassuring.  But, it is important that you monitor your condition carefully, and do not hesitate to return to the ED if you develop new, or concerning changes in your condition.  Otherwise, please follow-up with your physician for appropriate ongoing care.  Please be sure to discuss today's evaluation and its reassuring results. In addition, please be sure to discuss your new medication being added to your blood pressure medication regimen, and consider whether this needs a longer prescription, or an alternative.

## 2018-09-09 NOTE — ED Provider Notes (Signed)
[facility] EMERGENCY DEPARTMENT Provider Note   CSN: [id number] Arrival date & time: 09/09/18  1124    History   Chief Complaint Chief Complaint  Patient presents with  . Hypertension    HPI [name] is a 78 y.o. female.     HPI Patient presents with her husband due to concern of hypertension. Patient has a history of hypertension, for which she takes multiple medication for She notes that 2 weeks ago, on a routine visit with her physician she was found to have abnormally high blood pressure readings. She had no change in her medication, dosing or frequency. Today, on follow-up exam she again found blood pressure elevated, with a systolic greater than 200. She denies complaints including dyspnea, pain, fever, chills, weakness, urinary complaints. No other recent medication changes either, she was sent here for evaluation given the elevated systolic pressure Past Medical History:  Diagnosis Date  . Hypertension   . Kidney calculi     There are no active problems to display for this patient.   Past Surgical History:  Procedure Laterality Date  . CESAREAN SECTION       OB History   No obstetric history on file.      Home Medications    Prior to Admission medications   Medication Sig Start Date End Date Taking? Authorizing Provider  atorvastatin (LIPITOR) 10 MG tablet Take 10 mg by mouth.   Yes [provider]  carvedilol (COREG) 3.125 MG tablet Take 3.125 mg by mouth 2 (two) times daily with a meal.   Yes [provider]  losartan (COZAAR) 100 MG tablet Take 100 mg by mouth daily.   Yes [provider]    Family History No family history on file.  Social History Social History   Tobacco Use  . Smoking status: Never Smoker  Substance Use Topics  . Alcohol use: No  . Drug use: Not on file     Allergies   Patient has no known allergies.   Review of Systems Review of Systems  Constitutional:       Per HPI,  otherwise negative  HENT:       Per HPI, otherwise negative  Respiratory:       Per HPI, otherwise negative  Cardiovascular:       Per HPI, otherwise negative  Gastrointestinal: Negative for vomiting.  Endocrine:       Negative aside from HPI  Genitourinary:       Neg aside from HPI   Musculoskeletal:       Per HPI, otherwise negative  Skin: Negative.   Neurological: Negative for syncope.     Physical Exam Updated Vital Signs BP (!) 182/94   Pulse 81   Temp 98.2 F (36.8 C) (Oral)   Resp 16   Ht 5\' 10"  (1.778 m)   Wt 74.4 kg   SpO2 97%   BMI 23.53 kg/m   Physical Exam Vitals signs and nursing note reviewed.  Constitutional:      General: She is not in acute distress.    Appearance: She is well-developed.  HENT:     Head: Normocephalic and atraumatic.  Eyes:     Conjunctiva/sclera: Conjunctivae normal.  Cardiovascular:     Rate and Rhythm: Normal rate and regular rhythm.  Pulmonary:     Effort: Pulmonary effort is normal. No respiratory distress.     Breath sounds: Normal breath sounds. No stridor.  Abdominal:     General: There is no  distension.  Skin:    General: Skin is warm and dry.  Neurological:     Mental Status: She is alert and oriented to person, place, and time.     Cranial Nerves: No cranial nerve deficit.      ED Treatments / Results  Labs (all labs ordered are listed, but only abnormal results are displayed) Labs Reviewed  COMPREHENSIVE METABOLIC PANEL - Abnormal; Notable for the following components:      Result Value   Glucose, Bld 109 (*)    Total Bilirubin 1.6 (*)    All other components within normal limits  URINALYSIS, ROUTINE W REFLEX MICROSCOPIC - Abnormal; Notable for the following components:   Color, Urine STRAW (*)    Hgb urine dipstick SMALL (*)    Bacteria, UA FEW (*)    All other components within normal limits  CBC WITH DIFFERENTIAL/PLATELET    EKG EKG Interpretation  Date/Time:  Thursday September 09 2018  12:02:05 EST Ventricular Rate:  70 PR Interval:    QRS Duration: 85 QT Interval:  371 QTC Calculation: 401 R Axis:   50 Text Interpretation:  Sinus rhythm Probable anteroseptal infarct, old Baseline wander ST-t wave abnormality Abnormal ekg Confirmed by Gerhard Munch [phone]) on 09/09/2018 4:16:34 PM   Radiology No results found.  Procedures Procedures (including critical care time)  Medications Ordered in ED Medications  hydrALAZINE (APRESOLINE) injection 10 mg (10 mg Intravenous Given 09/09/18 1253)     Initial Impression / Assessment and Plan / ED Course  I have reviewed the triage vital signs and the nursing notes.  Pertinent labs & imaging results that were available during my care of the patient were reviewed by me and considered in my medical decision making (see chart for details).    Patient in no distress initially, but with systolic greater than 220, the patient will receive hydralazine.    4:28 PM On repeat exam the patient is in no distress. We discussed today's findings, which are reassuring, with no evidence for endorgan damage. She confirms that she was actually feeling good today, better than usual, when she was informed of her elevated blood pressure, sent here for evaluation. With no evidence for endorgan effects, reduction in the blood pressure numbers here, the patient is appropriate for discharge with close outpatient follow-up We discussed the importance of following her new medication regimen, following up with primary care within the coming days, and returning for concerning changes.  Final Clinical Impressions(s) / ED Diagnoses  Hypertensive urgency   Gerhard Munch, MD 09/09/18 [phone]

## 2018-09-13 DIAGNOSIS — I10 Essential (primary) hypertension: Secondary | ICD-10-CM | POA: Diagnosis not present

## 2018-09-13 DIAGNOSIS — Z6824 Body mass index (BMI) 24.0-24.9, adult: Secondary | ICD-10-CM | POA: Diagnosis not present

## 2018-09-13 DIAGNOSIS — Z1389 Encounter for screening for other disorder: Secondary | ICD-10-CM | POA: Diagnosis not present

## 2018-10-08 DIAGNOSIS — M62830 Muscle spasm of back: Secondary | ICD-10-CM | POA: Diagnosis not present

## 2018-10-08 DIAGNOSIS — Z0001 Encounter for general adult medical examination with abnormal findings: Secondary | ICD-10-CM | POA: Diagnosis not present

## 2018-10-08 DIAGNOSIS — M461 Sacroiliitis, not elsewhere classified: Secondary | ICD-10-CM | POA: Diagnosis not present

## 2018-10-08 DIAGNOSIS — Z23 Encounter for immunization: Secondary | ICD-10-CM | POA: Diagnosis not present

## 2018-10-08 DIAGNOSIS — M47816 Spondylosis without myelopathy or radiculopathy, lumbar region: Secondary | ICD-10-CM | POA: Diagnosis not present

## 2018-10-08 DIAGNOSIS — Z6823 Body mass index (BMI) 23.0-23.9, adult: Secondary | ICD-10-CM | POA: Diagnosis not present

## 2018-11-17 DIAGNOSIS — Z23 Encounter for immunization: Secondary | ICD-10-CM | POA: Diagnosis not present

## 2018-11-17 DIAGNOSIS — E7849 Other hyperlipidemia: Secondary | ICD-10-CM | POA: Diagnosis not present

## 2018-11-17 DIAGNOSIS — Z0001 Encounter for general adult medical examination with abnormal findings: Secondary | ICD-10-CM | POA: Diagnosis not present

## 2018-11-17 DIAGNOSIS — Z0000 Encounter for general adult medical examination without abnormal findings: Secondary | ICD-10-CM | POA: Diagnosis not present

## 2018-11-17 DIAGNOSIS — E782 Mixed hyperlipidemia: Secondary | ICD-10-CM | POA: Diagnosis not present

## 2018-11-17 DIAGNOSIS — Z1389 Encounter for screening for other disorder: Secondary | ICD-10-CM | POA: Diagnosis not present

## 2018-11-17 DIAGNOSIS — E039 Hypothyroidism, unspecified: Secondary | ICD-10-CM | POA: Diagnosis not present

## 2019-03-22 DIAGNOSIS — H35371 Puckering of macula, right eye: Secondary | ICD-10-CM | POA: Diagnosis not present

## 2019-03-22 DIAGNOSIS — H25812 Combined forms of age-related cataract, left eye: Secondary | ICD-10-CM | POA: Diagnosis not present

## 2019-03-22 DIAGNOSIS — H25813 Combined forms of age-related cataract, bilateral: Secondary | ICD-10-CM | POA: Diagnosis not present

## 2019-04-27 NOTE — H&P (Signed)
Surgical History & Physical  Patient Name: [name] DOB: [birth year]  Surgery: Cataract extraction with intraocular lens implant phacoemulsification; Left Eye  Surgeon: Baruch Goldmann MD Surgery Date:  05/02/2019 Pre-Op Date:  04/25/2019  HPI: A 5 Yr. old female patient referred by Dr. Jorja Loa 1. 1. The patient complains of difficulty when viewing TV, reading closed caption, news scrolls on TV, which began 1 years ago. Both eyes are affected. The episode is constant and gradual. The patient describes foggy, glare and hazy symptoms affecting their eyes/vision. The condition's severity is moderate. Symptoms occur when the patient is driving and reading. Patient not driving at night due to glare with lights. This is negatively affecting the patient's quality of life .Only drives during the day if needed due to poor [place]. Unable to see TV or captions well. Patient ready to proceed with cataract surgery for BCVA. OS worse c/w OD.  Medical History: Cataracts High Blood Pressure LDL Anxiety  Review of Systems Negative Allergic/Immunologic Negative Cardiovascular Negative Constitutional Negative Ear, Nose, Mouth & Throat Negative Endocrine Negative Eyes Negative Gastrointestinal Negative Genitourinary Negative Hemotologic/Lymphatic Negative Integumentary Negative Musculoskeletal Negative Neurological Negative Psychiatry Negative Respiratory  Social   Former smoker    Medication Hydralazine, Carvedilol, Losartan Potassium, Alprazolam, Atorvastatin,   Sx/Procedures  None  Drug Allergies   NKDA  History & Physical: Heent:  Cataract, Left eye NECK: supple without bruits LUNGS: lungs clear to auscultation CV: regular rate and rhythm Abdomen: soft and non-tender  Impression & Plan: Assessment: 1.  COMBINED FORMS AGE RELATED CATARACT; Both Eyes (H25.813) 2.  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  Cataract accounts for the patient's decreased vision. This  visual impairment is not correctable with a tolerable change in glasses or contact lenses. Cataract surgery with an implantation of a new lens should significantly improve the visual and functional status of the patient. Discussed all risks, benefits, alternatives, and potential complications. Discussed the procedures and recovery. Patient desires to have surgery. A-scan ordered and performed today for intra-ocular lens calculations. The surgery will be performed in order to improve vision for driving, reading, and for eye examinations. Recommend phacoemulsification with intra-ocular lens. Left Eye. Dilates well - shugarcaine by protocol. 2.  Discussed diagnosis in detail with patient. No treatment is required at this time. Will continue to observe condition and or symptoms.

## 2019-04-27 NOTE — Patient Instructions (Signed)
[name]  04/27/2019     @PREFPERIOPPHARMACY @   Your procedure is scheduled on  05/02/2019 .  Report to [facility] at  1225  P.M.  Call this number if you have problems the morning of surgery:  [phone]   Remember:  Do not eat or drink after midnight.                          Take these medicines the morning of surgery with A SIP OF WATER ; CARVEDILOL    Do not wear jewelry, make-up or nail polish.  Do not wear lotions, powders, or perfumes. Please wear deodorant and brush your teeth.  Do not shave 48 hours prior to surgery.  Men may shave face and neck.  Do not bring valuables to the hospital.  [facility] is not responsible for any belongings or valuables.  Contacts, dentures or bridgework may not be worn into surgery.  Leave your suitcase in the car.  After surgery it may be brought to your room.  For patients admitted to the hospital, discharge time will be determined by your treatment team.  Patients discharged the day of surgery will not be allowed to drive home.   Name and phone number of your driver:   family Special instructions:  None  Please read over the following fact sheets that you were given. Anesthesia Post-op Instructions and Care and Recovery After Surgery       Cataract Surgery, Care After This sheet gives you information about how to care for yourself after your procedure. Your health care provider may also give you more specific instructions. If you have problems or questions, contact your health care provider. What can I expect after the procedure? After the procedure, it is common to have:  Itching.  Discomfort.  Fluid discharge.  Sensitivity to light and to touch.  Bruising in or around the eye.  Mild blurred vision. Follow these instructions at home: Eye care   Do not touch or rub your eyes.  Protect your eyes as told by your health care provider. You may be told to wear a protective eye shield or  sunglasses.  Do not put a contact lens into the affected eye or eyes until your health care provider approves.  Keep the area around your eye clean and dry: ? Avoid swimming. ? Do not allow water to hit you directly in the face while showering. ? Keep soap and shampoo out of your eyes.  Check your eye every day for signs of infection. Watch for: ? Redness, swelling, or pain. ? Fluid, blood, or pus. ? Warmth. ? A bad smell. ? Vision that is getting worse. ? Sensitivity that is getting worse. Activity  Do not drive for 24 hours if you were given a sedative during your procedure.  Avoid strenuous activities, such as playing contact sports, for as long as told by your health care provider.  Do not drive or use heavy machinery until your health care provider approves.  Do not bend or lift heavy objects. Bending increases pressure in the eye. You can walk, climb stairs, and do light household chores.  Ask your health care provider when you can return to work. If you work in a dusty environment, you may be advised to wear protective eyewear for a period of time. General instructions  Take or apply over-the-counter and prescription medicines only as told by your health care provider.  This includes eye drops.  Keep all follow-up visits as told by your health care provider. This is important. Contact a health care provider if:  You have increased bruising around your eye.  You have pain that is not helped with medicine.  You have a fever.  You have redness, swelling, or pain in your eye.  You have fluid, blood, or pus coming from your incision.  Your vision gets worse.  Your sensitivity to light gets worse. Get help right away if:  You have sudden loss of vision.  You see flashes of light or spots (floaters).  You have severe eye pain.  You develop nausea or vomiting. Summary  After your procedure, it is common to have itching, discomfort, bruising, fluid discharge, or  sensitivity to light.  Follow instructions from your health care provider about caring for your eye after the procedure.  Do not rub your eye after the procedure. You may need to wear eye protection or sunglasses. Do not wear contact lenses. Keep the area around your eye clean and dry.  Avoid activities that require a lot of effort. These include playing sports and lifting heavy objects.  Contact a health care provider if you have increased bruising, pain that does not go away, or a fever. Get help right away if you suddenly lose your vision, see flashes of light or spots, or have severe pain in the eye. This information is not intended to replace advice given to you by your health care provider. Make sure you discuss any questions you have with your health care provider. Document Released: 01/24/2005 Document Revised: 01/04/2018 Document Reviewed: 01/04/2018 Elsevier Patient Education  2020 [employer] These instructions provide you with information about caring for yourself after your procedure. Your health care provider may also give you more specific instructions. Your treatment has been planned according to current medical practices, but problems sometimes occur. Call your health care provider if you have any problems or questions after your procedure. What can I expect after the procedure? After your procedure, you may:  Feel sleepy for several hours.  Feel clumsy and have poor balance for several hours.  Feel forgetful about what happened after the procedure.  Have poor judgment for several hours.  Feel nauseous or vomit.  Have a sore throat if you had a breathing tube during the procedure. Follow these instructions at home: For at least 24 hours after the procedure:      Have a responsible adult stay with you. It is important to have someone help care for you until you are awake and alert.  Rest as needed.  Do not: ? Participate  in activities in which you could fall or become injured. ? Drive. ? Use heavy machinery. ? Drink alcohol. ? Take sleeping pills or medicines that cause drowsiness. ? Make important decisions or sign legal documents. ? Take care of children on your own. Eating and drinking  Follow the diet that is recommended by your health care provider.  If you vomit, drink water, juice, or soup when you can drink without vomiting.  Make sure you have little or no nausea before eating solid foods. General instructions  Take over-the-counter and prescription medicines only as told by your health care provider.  If you have sleep apnea, surgery and certain medicines can increase your risk for breathing problems. Follow instructions from your health care provider about wearing your sleep device: ? Anytime you are sleeping, including during daytime naps. ?  While taking prescription pain medicines, sleeping medicines, or medicines that make you drowsy.  If you smoke, do not smoke without supervision.  Keep all follow-up visits as told by your health care provider. This is important. Contact a health care provider if:  You keep feeling nauseous or you keep vomiting.  You feel light-headed.  You develop a rash.  You have a fever. Get help right away if:  You have trouble breathing. Summary  For several hours after your procedure, you may feel sleepy and have poor judgment.  Have a responsible adult stay with you for at least 24 hours or until you are awake and alert. This information is not intended to replace advice given to you by your health care provider. Make sure you discuss any questions you have with your health care provider. Document Released: 10/28/2015 Document Revised: 10/05/2017 Document Reviewed: 10/28/2015 Elsevier Patient Education  2020 [employer].

## 2019-04-28 ENCOUNTER — Other Ambulatory Visit: Payer: Self-pay

## 2019-04-28 ENCOUNTER — Other Ambulatory Visit (HOSPITAL_COMMUNITY)
Admission: RE | Admit: 2019-04-28 | Discharge: 2019-04-28 | Disposition: A | Discharge status: Home / Self Care | Payer: Medicare Other | Source: Ambulatory Visit | Attending: Ophthalmology | Admitting: Ophthalmology

## 2019-04-28 ENCOUNTER — Encounter (HOSPITAL_COMMUNITY)
Admission: RE | Admit: 2019-04-28 | Discharge: 2019-04-28 | Disposition: A | Discharge status: Home / Self Care | Payer: Medicare Other | Source: Ambulatory Visit | Attending: Ophthalmology | Admitting: Ophthalmology

## 2019-04-28 HISTORY — DX: Personal history of urinary calculi: Z87.442

## 2019-04-29 ENCOUNTER — Other Ambulatory Visit: Payer: Self-pay

## 2019-04-29 ENCOUNTER — Encounter (HOSPITAL_COMMUNITY): Payer: Self-pay

## 2019-04-29 ENCOUNTER — Other Ambulatory Visit (HOSPITAL_COMMUNITY)
Admission: RE | Admit: 2019-04-29 | Discharge: 2019-04-29 | Disposition: A | Discharge status: Home / Self Care | Payer: Medicare Other | Source: Ambulatory Visit | Attending: Ophthalmology | Admitting: Ophthalmology

## 2019-04-29 DIAGNOSIS — Z01812 Encounter for preprocedural laboratory examination: Secondary | ICD-10-CM | POA: Diagnosis not present

## 2019-04-29 DIAGNOSIS — Z20828 Contact with and (suspected) exposure to other viral communicable diseases: Secondary | ICD-10-CM | POA: Diagnosis not present

## 2019-04-29 DIAGNOSIS — Z20822 Contact with and (suspected) exposure to covid-19: Secondary | ICD-10-CM

## 2019-04-29 LAB — SARS CORONAVIRUS 2 (TAT 6-24 HRS): SARS Coronavirus 2: NEGATIVE

## 2019-04-29 NOTE — Addendum Note (Signed)
Addended by: Marvene Staff on: 04/29/2019 01:44 PM   Modules accepted: Orders

## 2019-05-02 ENCOUNTER — Ambulatory Visit (HOSPITAL_COMMUNITY)
Admission: RE | Admit: 2019-05-02 | Discharge: 2019-05-02 | Disposition: A | Discharge status: Home / Self Care | Payer: Medicare Other | Attending: Ophthalmology | Admitting: Ophthalmology

## 2019-05-02 ENCOUNTER — Encounter (HOSPITAL_COMMUNITY)
Admission: RE | Disposition: A | Discharge status: Home / Self Care | Payer: Self-pay | Source: Home / Self Care | Attending: Ophthalmology

## 2019-05-02 ENCOUNTER — Ambulatory Visit (HOSPITAL_COMMUNITY): Payer: Medicare Other | Admitting: Anesthesiology

## 2019-05-02 DIAGNOSIS — F419 Anxiety disorder, unspecified: Secondary | ICD-10-CM | POA: Diagnosis not present

## 2019-05-02 DIAGNOSIS — E7800 Pure hypercholesterolemia, unspecified: Secondary | ICD-10-CM | POA: Insufficient documentation

## 2019-05-02 DIAGNOSIS — I10 Essential (primary) hypertension: Secondary | ICD-10-CM | POA: Diagnosis not present

## 2019-05-02 DIAGNOSIS — Z87891 Personal history of nicotine dependence: Secondary | ICD-10-CM | POA: Insufficient documentation

## 2019-05-02 DIAGNOSIS — H2512 Age-related nuclear cataract, left eye: Secondary | ICD-10-CM | POA: Diagnosis not present

## 2019-05-02 DIAGNOSIS — Z79899 Other long term (current) drug therapy: Secondary | ICD-10-CM | POA: Insufficient documentation

## 2019-05-02 DIAGNOSIS — H25812 Combined forms of age-related cataract, left eye: Secondary | ICD-10-CM | POA: Diagnosis not present

## 2019-05-02 HISTORY — PX: CATARACT EXTRACTION W/PHACO: SHX586

## 2019-05-02 SURGERY — PHACOEMULSIFICATION, CATARACT, WITH IOL INSERTION
Anesthesia: Monitor Anesthesia Care | Site: Eye | Laterality: Left

## 2019-05-02 MED ORDER — LIDOCAINE HCL (PF) 1 % IJ SOLN
INTRAOCULAR | Status: DC | PRN
  Administered 2019-05-02: 13:00:00 1 mL via OPHTHALMIC

## 2019-05-02 MED ORDER — NEOMYCIN-POLYMYXIN-DEXAMETH 3.5-10000-0.1 OP SUSP
OPHTHALMIC | Status: DC | PRN
  Administered 2019-05-02: 2 [drp] via OPHTHALMIC

## 2019-05-02 MED ORDER — MIDAZOLAM HCL 5 MG/5ML IJ SOLN
INTRAMUSCULAR | Status: DC | PRN
  Administered 2019-05-02: 1 mg via INTRAVENOUS

## 2019-05-02 MED ORDER — LIDOCAINE HCL 3.5 % OP GEL
1.0000 "application " | Freq: Once | OPHTHALMIC | Status: AC
  Administered 2019-05-02: 1 via OPHTHALMIC

## 2019-05-02 MED ORDER — PHENYLEPHRINE HCL 2.5 % OP SOLN
1.0000 [drp] | OPHTHALMIC | Status: AC | PRN
  Administered 2019-05-02 (×3): 1 [drp] via OPHTHALMIC

## 2019-05-02 MED ORDER — SODIUM HYALURONATE 23 MG/ML IO SOLN
INTRAOCULAR | Status: DC | PRN
  Administered 2019-05-02: 0.6 mL via INTRAOCULAR

## 2019-05-02 MED ORDER — MIDAZOLAM HCL 2 MG/2ML IJ SOLN
INTRAMUSCULAR | Status: AC
  Filled 2019-05-02: qty 2

## 2019-05-02 MED ORDER — CYCLOPENTOLATE-PHENYLEPHRINE 0.2-1 % OP SOLN
1.0000 [drp] | OPHTHALMIC | Status: AC | PRN
  Administered 2019-05-02 (×3): 1 [drp] via OPHTHALMIC

## 2019-05-02 MED ORDER — TETRACAINE HCL 0.5 % OP SOLN
1.0000 [drp] | OPHTHALMIC | Status: AC | PRN
  Administered 2019-05-02 (×3): 1 [drp] via OPHTHALMIC

## 2019-05-02 MED ORDER — BSS IO SOLN
INTRAOCULAR | Status: DC | PRN
  Administered 2019-05-02: 15 mL via INTRAOCULAR

## 2019-05-02 MED ORDER — EPINEPHRINE PF 1 MG/ML IJ SOLN
INTRAOCULAR | Status: DC | PRN
  Administered 2019-05-02: 13:00:00 500 mL

## 2019-05-02 MED ORDER — POVIDONE-IODINE 5 % OP SOLN
OPHTHALMIC | Status: DC | PRN
  Administered 2019-05-02: 1 via OPHTHALMIC

## 2019-05-02 MED ORDER — PROVISC 10 MG/ML IO SOLN
INTRAOCULAR | Status: DC | PRN
  Administered 2019-05-02: 0.85 mL via INTRAOCULAR

## 2019-05-02 MED ORDER — LACTATED RINGERS IV SOLN: INTRAVENOUS | Status: DC

## 2019-05-02 SURGICAL SUPPLY — 13 items

## 2019-05-02 NOTE — Interval H&P Note (Signed)
History and Physical Interval Note: The H and P was reviewed and updated. The patient was examined.  No changes were found after exam.  The surgical eye was marked.  05/02/2019 1:03 PM  [name]  has presented today for surgery, with the diagnosis of Nuclear sclerotic cataract - Left eye.  The various methods of treatment have been discussed with the patient and family. After consideration of risks, benefits and other options for treatment, the patient has consented to  Procedure(s) with comments: CATARACT EXTRACTION PHACO AND INTRAOCULAR LENS PLACEMENT ([place]) (Left) - left as a surgical intervention.  The patient's history has been reviewed, patient examined, no change in status, stable for surgery.  I have reviewed the patient's chart and labs.  Questions were answered to the patient's satisfaction.     Baruch Goldmann

## 2019-05-02 NOTE — Anesthesia Preprocedure Evaluation (Signed)
Anesthesia Evaluation  Patient identified by MRN, date of birth, ID band Patient awake    Reviewed: Allergy & Precautions, NPO status , Patient's Chart, lab work & pertinent test results  Airway Mallampati: II  TM Distance: >3 FB Neck ROM: Full    Dental no notable dental hx. (+) Teeth Intact   Pulmonary neg pulmonary ROS, former smoker,    Pulmonary exam normal breath sounds clear to auscultation       Cardiovascular Exercise Tolerance: Good hypertension, Pt. on medications and Pt. on home beta blockers negative cardio ROS Normal cardiovascular examI Rhythm:Regular Rate:Normal     Neuro/Psych Seizures -, Well Controlled,  Reports childhood Sz -none in 50 years -no current Sz meds negative psych ROS   GI/Hepatic negative GI ROS, Neg liver ROS,   Endo/Other  negative endocrine ROS  Renal/GU negative Renal ROS  negative genitourinary   Musculoskeletal negative musculoskeletal ROS (+)   Abdominal   Peds negative pediatric ROS (+)  Hematology negative hematology ROS (+)   Anesthesia Other Findings   Reproductive/Obstetrics negative OB ROS                             Anesthesia Physical Anesthesia Plan  ASA: II  Anesthesia Plan: MAC   Post-op Pain Management:    Induction: Intravenous  PONV Risk Score and Plan: 2 and Midazolam, Ondansetron, Treatment may vary due to age or medical condition and TIVA  Airway Management Planned: Simple Face Mask and Nasal Cannula  Additional Equipment:   Intra-op Plan:   Post-operative Plan:   Informed Consent: I have reviewed the patients History and Physical, chart, labs and discussed the procedure including the risks, benefits and alternatives for the proposed anesthesia with the patient or authorized representative who has indicated his/her understanding and acceptance.     Dental advisory given  Plan Discussed with: CRNA  Anesthesia  Plan Comments: (Plan Full PPE use Plan MAC D/W PT -WTP with same after Q&A  Repeat BP ~190/90 anxious  First IOL)        Anesthesia Quick Evaluation

## 2019-05-02 NOTE — Anesthesia Postprocedure Evaluation (Signed)
Anesthesia Post Note  Patient: [name]  Procedure(s) Performed: CATARACT EXTRACTION PHACO AND INTRAOCULAR LENS PLACEMENT ([place]) (Left Eye)  Patient location during evaluation: PACU Anesthesia Type: MAC Level of consciousness: awake and alert and oriented Pain management: pain level controlled Vital Signs Assessment: post-procedure vital signs reviewed and stable Respiratory status: spontaneous breathing Cardiovascular status: stable and blood pressure returned to baseline Postop Assessment: no apparent nausea or vomiting Anesthetic complications: no     Last Vitals:  Vitals:   05/02/19 1240  BP: (!) 214/104  Pulse: 82  Resp: 20  Temp: 37 C  SpO2: 96%    Last Pain:  Vitals:   05/02/19 1240  TempSrc: Oral  PainSc: 0-No pain                 Dyna Figuereo

## 2019-05-02 NOTE — Discharge Instructions (Signed)
Please discharge patient when stable, will follow up today with Dr. Signe Tackitt at the [facility] office immediately following discharge.  Leave shield in place until visit.  All paperwork with discharge instructions will be given at the office. ° °

## 2019-05-02 NOTE — Op Note (Signed)
Date of procedure: 05/02/19  Pre-operative diagnosis: Visually significant age-related combined cataract, Left Eye (H25.812)  Post-operative diagnosis: Visually significant age-related combined cataract, Left Eye (H25.812)  Procedure: Removal of cataract via phacoemulsification and insertion of intra-ocular lens [employer] PCB00  +21.5D into the capsular bag of the Left Eye  Attending surgeon: Gerda Diss. Nefertari Rebman, MD, MA  Anesthesia: MAC, Topical Akten  Complications: None  Estimated Blood Loss: <41m (minimal)  Specimens: None  Implants: As above  Indications:  Visually significant age-related cataract, Left Eye  Procedure:  The patient was seen and identified in the pre-operative area. The operative eye was identified and dilated.  The operative eye was marked.  Topical anesthesia was administered to the operative eye.     The patient was then to the operative suite and placed in the supine position.  A timeout was performed confirming the patient, procedure to be performed, and all other relevant information.   The patient's face was prepped and draped in the usual fashion for intra-ocular surgery.  A lid speculum was placed into the operative eye and the surgical microscope moved into place and focused.  An inferotemporal paracentesis was created using a 20 gauge paracentesis blade.  Shugarcaine was injected into the anterior chamber.  Viscoelastic was injected into the anterior chamber.  A temporal clear-corneal main wound incision was created using a 2.410mmicrokeratome.  A continuous curvilinear capsulorrhexis was initiated using an irrigating cystitome and completed using capsulorrhexis forceps.  Hydrodissection and hydrodeliniation were performed.  Viscoelastic was injected into the anterior chamber.  A phacoemulsification handpiece and a chopper as a second instrument were used to remove the nucleus and epinucleus. The irrigation/aspiration handpiece was used to remove  any remaining cortical material.   The capsular bag was reinflated with viscoelastic, checked, and found to be intact.  The intraocular lens was inserted into the capsular bag.  The irrigation/aspiration handpiece was used to remove any remaining viscoelastic.  The clear corneal wound and paracentesis wounds were then hydrated and checked with Weck-Cels to be watertight.  The lid-speculum and drape was removed, and the patient's face was cleaned with a wet and dry 4x4.  Maxitrol was instilled in the eye before a clear shield was taped over the eye. The patient was taken to the post-operative care unit in good condition, having tolerated the procedure well.  Post-Op Instructions: [facility] and will receive all other orders and instructions.

## 2019-05-02 NOTE — Transfer of Care (Signed)
Immediate Anesthesia Transfer of Care Note  Patient: [name]  Procedure(s) Performed: CATARACT EXTRACTION PHACO AND INTRAOCULAR LENS PLACEMENT ([facility]) (Left Eye)  Patient Location: Short Stay  Anesthesia Type:MAC  Level of Consciousness: awake  Airway & Oxygen Therapy: Patient Spontanous Breathing  Post-op Assessment: Report given to RN  Post vital signs: Reviewed  Last Vitals:  Vitals Value Taken Time  BP    Temp    Pulse    Resp    SpO2      Last Pain:  Vitals:   05/02/19 1240  TempSrc: Oral  PainSc: 0-No pain      Patients Stated Pain Goal: 5 (28/41/32 4401)  Complications: No apparent anesthesia complications

## 2019-05-03 ENCOUNTER — Encounter (HOSPITAL_COMMUNITY): Payer: Self-pay | Admitting: Ophthalmology

## 2019-05-09 DIAGNOSIS — H25811 Combined forms of age-related cataract, right eye: Secondary | ICD-10-CM | POA: Diagnosis not present

## 2019-05-10 NOTE — H&P (Signed)
Surgical History & Physical  Patient Name: [name] DOB: 11-05-1940  Surgery: Cataract extraction with intraocular lens implant phacoemulsification; Right Eye  Surgeon: Baruch Goldmann MD Surgery Date:  05/16/2019 Pre-Op Date:  05/09/2019  HPI: A 19 Yr. old female patient 1. 1. The patient complains of difficulty when viewing TV, reading closed caption, news scrolls on TV, which began 1 year ago. The right eye is affected. The episode is gradual. The condition's severity increased since last visit. Symptoms occur when the patient is inside, outside and reading. The complaint is associated with glare. This is negatively affecting the patient's quality of life. 2. The patient is returning after cataract post-op. The left eye is affected. Status post cataract post-op, which began 1 week ago: Since the last visit, the affected area feels improvement. The patient's vision is improved. Patient is following medication instructions. HPI was performed by Baruch Goldmann .  Medical History: Cataracts High Blood Pressure LDL Anxiety  Review of Systems Negative Allergic/Immunologic Negative Cardiovascular Negative Constitutional Negative Ear, Nose, Mouth & Throat Negative Endocrine Negative Eyes Negative Gastrointestinal Negative Genitourinary Negative Hemotologic/Lymphatic Negative Integumentary Negative Musculoskeletal Negative Neurological Negative Psychiatry Negative Respiratory  Social   Former smoker  Medication Prednisolone-gatiflox-bromfenac,  Hydralazine, Carvedilol, Losartan Potassium, Alprazolam, Atorvastatin,   Sx/Procedures Phaco c IOL,   Drug Allergies   NKDA  History & Physical: Heent:  Cataract, Right eye NECK: supple without bruits LUNGS: lungs clear to auscultation CV: regular rate and rhythm Abdomen: soft and non-tender  Impression & Plan: Assessment: 1.  COMBINED FORMS AGE RELATED CATARACT; Right Eye (H25.811) 2.  CATARACT EXTRACTION STATUS; Left Eye  (Z98.42)  Plan: 1.  Cataract accounts for the patient's decreased vision. This visual impairment is not correctable with a tolerable change in glasses or contact lenses. Cataract surgery with an implantation of a new lens should significantly improve the visual and functional status of the patient. Discussed all risks, benefits, alternatives, and potential complications. Discussed the procedures and recovery. Patient desires to have surgery. A-scan ordered and performed today for intra-ocular lens calculations. The surgery will be performed in order to improve vision for driving, reading, and for eye examinations. Recommend phacoemulsification with intra-ocular lens. Right Eye. Surgery required to correct imbalance of vision. Dilates well - shugarcaine by protocol. 2.  1 week after cataract surgery. Doing well with improved vision and normal eye pressure. Call with any problems or concerns. Continue Gati-Brom-Pred 2x/day for 3 more weeks.

## 2019-05-12 ENCOUNTER — Encounter (HOSPITAL_COMMUNITY)
Admission: RE | Admit: 2019-05-12 | Discharge: 2019-05-12 | Disposition: A | Discharge status: Home / Self Care | Payer: Medicare Other | Source: Ambulatory Visit | Attending: Ophthalmology | Admitting: Ophthalmology

## 2019-05-12 ENCOUNTER — Other Ambulatory Visit (HOSPITAL_COMMUNITY)
Admission: RE | Admit: 2019-05-12 | Discharge: 2019-05-12 | Disposition: A | Discharge status: Home / Self Care | Payer: Medicare Other | Source: Ambulatory Visit | Attending: Ophthalmology | Admitting: Ophthalmology

## 2019-05-12 ENCOUNTER — Other Ambulatory Visit: Payer: Self-pay

## 2019-05-12 DIAGNOSIS — Z01812 Encounter for preprocedural laboratory examination: Secondary | ICD-10-CM | POA: Insufficient documentation

## 2019-05-12 DIAGNOSIS — Z20828 Contact with and (suspected) exposure to other viral communicable diseases: Secondary | ICD-10-CM | POA: Diagnosis not present

## 2019-05-12 LAB — SARS CORONAVIRUS 2 (TAT 6-24 HRS): SARS Coronavirus 2: NEGATIVE

## 2019-05-13 ENCOUNTER — Other Ambulatory Visit (HOSPITAL_COMMUNITY): Payer: Medicare Other

## 2019-05-16 ENCOUNTER — Other Ambulatory Visit: Payer: Self-pay

## 2019-05-16 ENCOUNTER — Ambulatory Visit (HOSPITAL_COMMUNITY)
Admission: RE | Admit: 2019-05-16 | Discharge: 2019-05-16 | Disposition: A | Discharge status: Home / Self Care | Payer: Medicare Other | Attending: Ophthalmology | Admitting: Ophthalmology

## 2019-05-16 ENCOUNTER — Encounter (HOSPITAL_COMMUNITY): Payer: Self-pay

## 2019-05-16 ENCOUNTER — Encounter (HOSPITAL_COMMUNITY)
Admission: RE | Disposition: A | Discharge status: Home / Self Care | Payer: Self-pay | Source: Home / Self Care | Attending: Ophthalmology

## 2019-05-16 ENCOUNTER — Ambulatory Visit (HOSPITAL_COMMUNITY): Payer: Medicare Other | Admitting: Anesthesiology

## 2019-05-16 DIAGNOSIS — F419 Anxiety disorder, unspecified: Secondary | ICD-10-CM | POA: Diagnosis not present

## 2019-05-16 DIAGNOSIS — I10 Essential (primary) hypertension: Secondary | ICD-10-CM | POA: Diagnosis not present

## 2019-05-16 DIAGNOSIS — Z79899 Other long term (current) drug therapy: Secondary | ICD-10-CM | POA: Diagnosis not present

## 2019-05-16 DIAGNOSIS — H25811 Combined forms of age-related cataract, right eye: Secondary | ICD-10-CM | POA: Diagnosis not present

## 2019-05-16 DIAGNOSIS — Z87891 Personal history of nicotine dependence: Secondary | ICD-10-CM | POA: Insufficient documentation

## 2019-05-16 HISTORY — PX: CATARACT EXTRACTION W/PHACO: SHX586

## 2019-05-16 SURGERY — PHACOEMULSIFICATION, CATARACT, WITH IOL INSERTION
Anesthesia: Monitor Anesthesia Care | Site: Eye | Laterality: Right

## 2019-05-16 MED ORDER — PHENYLEPHRINE HCL 2.5 % OP SOLN
1.0000 [drp] | OPHTHALMIC | Status: AC | PRN
  Administered 2019-05-16 (×3): 1 [drp] via OPHTHALMIC

## 2019-05-16 MED ORDER — MIDAZOLAM HCL 2 MG/2ML IJ SOLN: 0.5000 mg | Freq: Once | INTRAMUSCULAR | Status: DC | PRN

## 2019-05-16 MED ORDER — LIDOCAINE HCL (PF) 1 % IJ SOLN
INTRAOCULAR | Status: DC | PRN
  Administered 2019-05-16: 1 mL via OPHTHALMIC

## 2019-05-16 MED ORDER — BSS IO SOLN
INTRAOCULAR | Status: DC | PRN
  Administered 2019-05-16: 15 mL via INTRAOCULAR

## 2019-05-16 MED ORDER — CYCLOPENTOLATE-PHENYLEPHRINE 0.2-1 % OP SOLN
1.0000 [drp] | OPHTHALMIC | Status: AC | PRN
  Administered 2019-05-16 (×3): 1 [drp] via OPHTHALMIC

## 2019-05-16 MED ORDER — LIDOCAINE HCL 3.5 % OP GEL
1.0000 "application " | Freq: Once | OPHTHALMIC | Status: AC
  Administered 2019-05-16: 1 via OPHTHALMIC

## 2019-05-16 MED ORDER — TETRACAINE HCL 0.5 % OP SOLN
1.0000 [drp] | OPHTHALMIC | Status: AC | PRN
  Administered 2019-05-16 (×3): 1 [drp] via OPHTHALMIC

## 2019-05-16 MED ORDER — SODIUM HYALURONATE 23 MG/ML IO SOLN
INTRAOCULAR | Status: DC | PRN
  Administered 2019-05-16: 0.6 mL via INTRAOCULAR

## 2019-05-16 MED ORDER — HYDROMORPHONE HCL 1 MG/ML IJ SOLN: 0.2500 mg | INTRAMUSCULAR | Status: DC | PRN

## 2019-05-16 MED ORDER — EPINEPHRINE PF 1 MG/ML IJ SOLN
INTRAMUSCULAR | Status: AC
  Filled 2019-05-16: qty 2

## 2019-05-16 MED ORDER — MIDAZOLAM HCL 2 MG/2ML IJ SOLN
INTRAMUSCULAR | Status: DC | PRN
  Administered 2019-05-16: 1 mg via INTRAVENOUS

## 2019-05-16 MED ORDER — PROMETHAZINE HCL 25 MG/ML IJ SOLN: 6.2500 mg | INTRAMUSCULAR | Status: DC | PRN

## 2019-05-16 MED ORDER — LABETALOL HCL 5 MG/ML IV SOLN
INTRAVENOUS | Status: AC
  Filled 2019-05-16: qty 4

## 2019-05-16 MED ORDER — PROVISC 10 MG/ML IO SOLN
INTRAOCULAR | Status: DC | PRN
  Administered 2019-05-16: 0.85 mL via INTRAOCULAR

## 2019-05-16 MED ORDER — POVIDONE-IODINE 5 % OP SOLN
OPHTHALMIC | Status: DC | PRN
  Administered 2019-05-16: 1 via OPHTHALMIC

## 2019-05-16 MED ORDER — HYDROCODONE-ACETAMINOPHEN 7.5-325 MG PO TABS: 1.0000 | ORAL_TABLET | Freq: Once | ORAL | Status: DC | PRN

## 2019-05-16 MED ORDER — NEOMYCIN-POLYMYXIN-DEXAMETH 3.5-10000-0.1 OP SUSP
OPHTHALMIC | Status: DC | PRN
  Administered 2019-05-16: 1 [drp] via OPHTHALMIC

## 2019-05-16 MED ORDER — MIDAZOLAM HCL 2 MG/2ML IJ SOLN
INTRAMUSCULAR | Status: AC
  Filled 2019-05-16: qty 2

## 2019-05-16 MED ORDER — LABETALOL HCL 5 MG/ML IV SOLN
10.0000 mg | INTRAVENOUS | Status: DC | PRN
  Administered 2019-05-16: 10 mg via INTRAVENOUS

## 2019-05-16 MED ORDER — LIDOCAINE 3.5 % OP GEL OPTIME - NO CHARGE
OPHTHALMIC | Status: DC | PRN
  Administered 2019-05-16: 1 [drp] via OPHTHALMIC

## 2019-05-16 MED ORDER — EPINEPHRINE PF 1 MG/ML IJ SOLN
INTRAOCULAR | Status: DC | PRN
  Administered 2019-05-16: 500 mL

## 2019-05-16 SURGICAL SUPPLY — 13 items

## 2019-05-16 NOTE — Anesthesia Preprocedure Evaluation (Addendum)
Anesthesia Evaluation  Patient identified by MRN, date of birth, ID band Patient awake    Reviewed: Allergy & Precautions, NPO status , Patient's Chart, lab work & pertinent test results, reviewed documented beta blocker date and time   Airway Mallampati: II  TM Distance: >3 FB Neck ROM: Full    Dental  (+) Upper Dentures, Missing   Pulmonary former smoker,    breath sounds clear to auscultation       Cardiovascular Exercise Tolerance: Good hypertension, Pt. on medications and Pt. on home beta blockers Normal cardiovascular exam Rhythm:Regular Rate:Normal     Neuro/Psych    GI/Hepatic Neg liver ROS,   Endo/Other    Renal/GU negative Renal ROS     Musculoskeletal   Abdominal   Peds  Hematology negative hematology ROS (+)   Anesthesia Other Findings   Reproductive/Obstetrics                           Anesthesia Physical Anesthesia Plan  ASA: III  Anesthesia Plan: MAC   Post-op Pain Management:    Induction:   PONV Risk Score and Plan:   Airway Management Planned: Nasal Cannula and Natural Airway  Additional Equipment:   Intra-op Plan:   Post-operative Plan:   Informed Consent: I have reviewed the patients History and Physical, chart, labs and discussed the procedure including the risks, benefits and alternatives for the proposed anesthesia with the patient or authorized representative who has indicated his/her understanding and acceptance.     Dental advisory given  Plan Discussed with: CRNA  Anesthesia Plan Comments: (Patients blood pressure was high 223/108 pulse - 88, will give labetalol 10 mg iv and reevaluate the BP before the procedure. )       Anesthesia Quick Evaluation

## 2019-05-16 NOTE — Interval H&P Note (Signed)
History and Physical Interval Note: The H and P was reviewed and updated. The patient was examined.  No changes were found after exam.  The surgical eye was marked.  05/16/2019 10:00 AM  [name]  has presented today for surgery, with the diagnosis of Nuclear sclerotic cataract - Right eye.  The various methods of treatment have been discussed with the patient and family. After consideration of risks, benefits and other options for treatment, the patient has consented to  Procedure(s) with comments: CATARACT EXTRACTION PHACO AND INTRAOCULAR LENS PLACEMENT ([facility]) (Right) - right as a surgical intervention.  The patient's history has been reviewed, patient examined, no change in status, stable for surgery.  I have reviewed the patient's chart and labs.  Questions were answered to the patient's satisfaction.     Baruch Goldmann

## 2019-05-16 NOTE — Discharge Instructions (Addendum)
Please discharge patient when stable, will follow up today with Dr. Wrzosek at the [facility] office immediately following discharge.  Leave shield in place until visit.  All paperwork with discharge instructions will be given at the office. ° ° ° ° °Monitored Anesthesia Care, Care After °These instructions provide you with information about caring for yourself after your procedure. Your health care provider may also give you more specific instructions. Your treatment has been planned according to current medical practices, but problems sometimes occur. Call your health care provider if you have any problems or questions after your procedure. °What can I expect after the procedure? °After your procedure, you may: °· Feel sleepy for several hours. °· Feel clumsy and have poor balance for several hours. °· Feel forgetful about what happened after the procedure. °· Have poor judgment for several hours. °· Feel nauseous or vomit. °· Have a sore throat if you had a breathing tube during the procedure. °Follow these instructions at home: °For at least 24 hours after the procedure: ° °  ° °· Have a responsible adult stay with you. It is important to have someone help care for you until you are awake and alert. °· Rest as needed. °· Do not: °? Participate in activities in which you could fall or become injured. °? Drive. °? Use heavy machinery. °? Drink alcohol. °? Take sleeping pills or medicines that cause drowsiness. °? Make important decisions or sign legal documents. °? Take care of children on your own. °Eating and drinking °· Follow the diet that is recommended by your health care provider. °· If you vomit, drink water, juice, or soup when you can drink without vomiting. °· Make sure you have little or no nausea before eating solid foods. °General instructions °· Take over-the-counter and prescription medicines only as told by your health care provider. °· If you have sleep apnea, surgery and certain  medicines can increase your risk for breathing problems. Follow instructions from your health care provider about wearing your sleep device: °? Anytime you are sleeping, including during daytime naps. °? While taking prescription pain medicines, sleeping medicines, or medicines that make you drowsy. °· If you smoke, do not smoke without supervision. °· Keep all follow-up visits as told by your health care provider. This is important. °Contact a health care provider if: °· You keep feeling nauseous or you keep vomiting. °· You feel light-headed. °· You develop a rash. °· You have a fever. °Get help right away if: °· You have trouble breathing. °Summary °· For several hours after your procedure, you may feel sleepy and have poor judgment. °· Have a responsible adult stay with you for at least 24 hours or until you are awake and alert. °This information is not intended to replace advice given to you by your health care provider. Make sure you discuss any questions you have with your health care provider. °Document Released: 10/28/2015 Document Revised: 10/05/2017 Document Reviewed: 10/28/2015 °Elsevier Patient Education © 2020 [employer] ° °

## 2019-05-16 NOTE — Op Note (Signed)
Date of procedure: 05/16/19  Pre-operative diagnosis:  Visually significant combined form age-related cataract, Right Eye (H25.811)  Post-operative diagnosis:  Visually significant combined form age-related cataract, Right Eye (H25.811)  Procedure: Removal of cataract via phacoemulsification and insertion of intra-ocular lens [employer] Vision PCB00  +21.0D into the capsular bag of the Right Eye  Attending surgeon: Gerda Diss. Zayvon Alicea, MD, MA  Anesthesia: MAC, Topical Akten  Complications: None  Estimated Blood Loss: <18m (minimal)  Specimens: None  Implants: As above  Indications:  Visually significant age-related cataract, Right Eye  Procedure:  The patient was seen and identified in the pre-operative area. The operative eye was identified and dilated.  The operative eye was marked.  Topical anesthesia was administered to the operative eye.     The patient was then to the operative suite and placed in the supine position.  A timeout was performed confirming the patient, procedure to be performed, and all other relevant information.   The patient's face was prepped and draped in the usual fashion for intra-ocular surgery.  A lid speculum was placed into the operative eye and the surgical microscope moved into place and focused.  A superotemporal paracentesis was created using a 20 gauge paracentesis blade.  Shugarcaine was injected into the anterior chamber.  Viscoelastic was injected into the anterior chamber.  A temporal clear-corneal main wound incision was created using a 2.424mmicrokeratome.  A continuous curvilinear capsulorrhexis was initiated using an irrigating cystitome and completed using capsulorrhexis forceps.  Hydrodissection and hydrodeliniation were performed.  Viscoelastic was injected into the anterior chamber.  A phacoemulsification handpiece and a chopper as a second instrument were used to remove the nucleus and epinucleus. The irrigation/aspiration handpiece was  used to remove any remaining cortical material.   The capsular bag was reinflated with viscoelastic, checked, and found to be intact.  The intraocular lens was inserted into the capsular bag.  The irrigation/aspiration handpiece was used to remove any remaining viscoelastic.  The clear corneal wound and paracentesis wounds were then hydrated and checked with Weck-Cels to be watertight.  The lid-speculum and drape was removed, and the patient's face was cleaned with a wet and dry 4x4.  Maxitrol was instilled in the eye before a clear shield was taped over the eye. The patient was taken to the post-operative care unit in good condition, having tolerated the procedure well.  Post-Op Instructions: [facility] and will receive all other orders and instructions.

## 2019-05-16 NOTE — Transfer of Care (Signed)
Immediate Anesthesia Transfer of Care Note  Patient: [name]  Procedure(s) Performed: CATARACT EXTRACTION PHACO AND INTRAOCULAR LENS PLACEMENT RIGHT EYE (Right Eye)  Patient Location: PACU  Anesthesia Type:MAC  Level of Consciousness: awake, alert , oriented and patient cooperative  Airway & Oxygen Therapy: Patient Spontanous Breathing  Post-op Assessment: Report given to RN, Post -op Vital signs reviewed and stable and Patient moving all extremities X 4  Post vital signs: Reviewed and stable  Last Vitals:  Vitals Value Taken Time  BP 172/88 05/16/19 1109  Temp 36.8 C 05/16/19 1109  Pulse 76 05/16/19 1109  Resp 14 05/16/19 1109  SpO2 96 % 05/16/19 1109    Last Pain:  Vitals:   05/16/19 1109  TempSrc: Oral  PainSc: 0-No pain         Complications: No apparent anesthesia complications

## 2019-05-16 NOTE — Anesthesia Postprocedure Evaluation (Signed)
Anesthesia Post Note  Patient: [name]  Procedure(s) Performed: CATARACT EXTRACTION PHACO AND INTRAOCULAR LENS PLACEMENT RIGHT EYE (Right Eye)  Patient location during evaluation: PACU Anesthesia Type: MAC Level of consciousness: awake, oriented and awake and alert Pain management: pain level controlled Vital Signs Assessment: post-procedure vital signs reviewed and stable Respiratory status: spontaneous breathing, respiratory function stable and nonlabored ventilation Cardiovascular status: stable Postop Assessment: no apparent nausea or vomiting Anesthetic complications: no     Last Vitals:  Vitals:   05/16/19 1023 05/16/19 1109  BP: (!) 166/87 (!) 172/88  Pulse:  76  Resp:  14  Temp:  36.8 C  SpO2:  96%    Last Pain:  Vitals:   05/16/19 1109  TempSrc: Oral  PainSc: 0-No pain                 Kimora Stankovic

## 2019-05-17 ENCOUNTER — Encounter (HOSPITAL_COMMUNITY): Payer: Self-pay | Admitting: Ophthalmology

## 2019-06-21 DIAGNOSIS — Z6824 Body mass index (BMI) 24.0-24.9, adult: Secondary | ICD-10-CM | POA: Diagnosis not present

## 2019-07-18 DIAGNOSIS — N1831 Chronic kidney disease, stage 3a: Secondary | ICD-10-CM | POA: Diagnosis not present

## 2019-07-18 DIAGNOSIS — R809 Proteinuria, unspecified: Secondary | ICD-10-CM | POA: Diagnosis not present

## 2019-07-18 DIAGNOSIS — Z79899 Other long term (current) drug therapy: Secondary | ICD-10-CM | POA: Diagnosis not present

## 2019-07-18 DIAGNOSIS — E559 Vitamin D deficiency, unspecified: Secondary | ICD-10-CM | POA: Diagnosis not present

## 2019-07-18 DIAGNOSIS — D649 Anemia, unspecified: Secondary | ICD-10-CM | POA: Diagnosis not present

## 2019-07-19 DIAGNOSIS — N189 Chronic kidney disease, unspecified: Secondary | ICD-10-CM | POA: Insufficient documentation

## 2019-07-19 DIAGNOSIS — I10 Essential (primary) hypertension: Secondary | ICD-10-CM | POA: Insufficient documentation

## 2019-07-19 DIAGNOSIS — N1831 Chronic kidney disease, stage 3a: Secondary | ICD-10-CM | POA: Insufficient documentation

## 2019-07-20 DIAGNOSIS — R809 Proteinuria, unspecified: Secondary | ICD-10-CM | POA: Diagnosis not present

## 2019-07-20 DIAGNOSIS — E559 Vitamin D deficiency, unspecified: Secondary | ICD-10-CM | POA: Diagnosis not present

## 2019-07-20 DIAGNOSIS — N182 Chronic kidney disease, stage 2 (mild): Secondary | ICD-10-CM | POA: Diagnosis not present

## 2019-07-20 DIAGNOSIS — E211 Secondary hyperparathyroidism, not elsewhere classified: Secondary | ICD-10-CM | POA: Diagnosis not present

## 2019-07-20 DIAGNOSIS — I129 Hypertensive chronic kidney disease with stage 1 through stage 4 chronic kidney disease, or unspecified chronic kidney disease: Secondary | ICD-10-CM | POA: Diagnosis not present

## 2019-09-01 ENCOUNTER — Encounter: Payer: Self-pay | Admitting: Internal Medicine

## 2019-11-01 DIAGNOSIS — I10 Essential (primary) hypertension: Secondary | ICD-10-CM | POA: Diagnosis not present

## 2019-11-01 DIAGNOSIS — R7309 Other abnormal glucose: Secondary | ICD-10-CM | POA: Diagnosis not present

## 2019-11-01 DIAGNOSIS — Z1389 Encounter for screening for other disorder: Secondary | ICD-10-CM | POA: Diagnosis not present

## 2019-11-01 DIAGNOSIS — Z6823 Body mass index (BMI) 23.0-23.9, adult: Secondary | ICD-10-CM | POA: Diagnosis not present

## 2019-11-01 DIAGNOSIS — Z0001 Encounter for general adult medical examination with abnormal findings: Secondary | ICD-10-CM | POA: Diagnosis not present

## 2019-11-01 DIAGNOSIS — N3281 Overactive bladder: Secondary | ICD-10-CM | POA: Diagnosis not present

## 2019-11-14 DIAGNOSIS — N390 Urinary tract infection, site not specified: Secondary | ICD-10-CM | POA: Diagnosis not present

## 2019-11-14 DIAGNOSIS — N3281 Overactive bladder: Secondary | ICD-10-CM | POA: Diagnosis not present

## 2019-12-20 DIAGNOSIS — R5383 Other fatigue: Secondary | ICD-10-CM | POA: Diagnosis not present

## 2019-12-20 DIAGNOSIS — E782 Mixed hyperlipidemia: Secondary | ICD-10-CM | POA: Diagnosis not present

## 2019-12-20 DIAGNOSIS — E7849 Other hyperlipidemia: Secondary | ICD-10-CM | POA: Diagnosis not present

## 2019-12-20 DIAGNOSIS — I10 Essential (primary) hypertension: Secondary | ICD-10-CM | POA: Diagnosis not present

## 2019-12-20 DIAGNOSIS — E559 Vitamin D deficiency, unspecified: Secondary | ICD-10-CM | POA: Diagnosis not present

## 2019-12-20 DIAGNOSIS — Z1389 Encounter for screening for other disorder: Secondary | ICD-10-CM | POA: Diagnosis not present

## 2019-12-20 DIAGNOSIS — E538 Deficiency of other specified B group vitamins: Secondary | ICD-10-CM | POA: Diagnosis not present

## 2020-02-07 DIAGNOSIS — Z1231 Encounter for screening mammogram for malignant neoplasm of breast: Secondary | ICD-10-CM | POA: Diagnosis not present

## 2020-02-24 DIAGNOSIS — H6121 Impacted cerumen, right ear: Secondary | ICD-10-CM | POA: Diagnosis not present

## 2020-02-24 DIAGNOSIS — H9203 Otalgia, bilateral: Secondary | ICD-10-CM | POA: Diagnosis not present

## 2020-02-24 DIAGNOSIS — H6122 Impacted cerumen, left ear: Secondary | ICD-10-CM | POA: Diagnosis not present

## 2020-03-15 DIAGNOSIS — M85832 Other specified disorders of bone density and structure, left forearm: Secondary | ICD-10-CM | POA: Diagnosis not present

## 2020-04-14 ENCOUNTER — Inpatient Hospital Stay (HOSPITAL_COMMUNITY)
Admission: EM | Admit: 2020-04-14 | Discharge: 2020-04-16 | DRG: 177 | Disposition: A | Discharge status: Home / Self Care | Payer: Medicare Other | Attending: Family Medicine | Admitting: Family Medicine

## 2020-04-14 ENCOUNTER — Emergency Department (HOSPITAL_COMMUNITY): Payer: Medicare Other

## 2020-04-14 ENCOUNTER — Encounter (HOSPITAL_COMMUNITY): Payer: Self-pay | Admitting: Emergency Medicine

## 2020-04-14 ENCOUNTER — Other Ambulatory Visit: Payer: Self-pay

## 2020-04-14 DIAGNOSIS — R531 Weakness: Secondary | ICD-10-CM

## 2020-04-14 DIAGNOSIS — Z87442 Personal history of urinary calculi: Secondary | ICD-10-CM

## 2020-04-14 DIAGNOSIS — R630 Anorexia: Secondary | ICD-10-CM | POA: Diagnosis present

## 2020-04-14 DIAGNOSIS — J1282 Pneumonia due to coronavirus disease 2019: Secondary | ICD-10-CM | POA: Diagnosis not present

## 2020-04-14 DIAGNOSIS — U071 COVID-19: Secondary | ICD-10-CM | POA: Diagnosis not present

## 2020-04-14 DIAGNOSIS — Z9841 Cataract extraction status, right eye: Secondary | ICD-10-CM

## 2020-04-14 DIAGNOSIS — R6889 Other general symptoms and signs: Secondary | ICD-10-CM | POA: Diagnosis not present

## 2020-04-14 DIAGNOSIS — I10 Essential (primary) hypertension: Secondary | ICD-10-CM | POA: Diagnosis present

## 2020-04-14 DIAGNOSIS — R509 Fever, unspecified: Secondary | ICD-10-CM | POA: Diagnosis not present

## 2020-04-14 DIAGNOSIS — Z8249 Family history of ischemic heart disease and other diseases of the circulatory system: Secondary | ICD-10-CM | POA: Diagnosis not present

## 2020-04-14 DIAGNOSIS — Z79899 Other long term (current) drug therapy: Secondary | ICD-10-CM | POA: Diagnosis not present

## 2020-04-14 DIAGNOSIS — Z743 Need for continuous supervision: Secondary | ICD-10-CM | POA: Diagnosis not present

## 2020-04-14 DIAGNOSIS — Z961 Presence of intraocular lens: Secondary | ICD-10-CM | POA: Diagnosis not present

## 2020-04-14 DIAGNOSIS — Z6820 Body mass index (BMI) 20.0-20.9, adult: Secondary | ICD-10-CM | POA: Diagnosis not present

## 2020-04-14 DIAGNOSIS — I169 Hypertensive crisis, unspecified: Secondary | ICD-10-CM | POA: Diagnosis present

## 2020-04-14 DIAGNOSIS — R0902 Hypoxemia: Secondary | ICD-10-CM | POA: Diagnosis not present

## 2020-04-14 DIAGNOSIS — Z9842 Cataract extraction status, left eye: Secondary | ICD-10-CM

## 2020-04-14 DIAGNOSIS — Z87891 Personal history of nicotine dependence: Secondary | ICD-10-CM

## 2020-04-14 LAB — COMPREHENSIVE METABOLIC PANEL
ALT: 40 U/L (ref 0–44)
AST: 38 U/L (ref 15–41)
Albumin: 3.8 g/dL (ref 3.5–5.0)
Alkaline Phosphatase: 60 U/L (ref 38–126)
Anion gap: 16 — ABNORMAL HIGH (ref 5–15)
BUN: 16 mg/dL (ref 8–23)
CO2: 22 mmol/L (ref 22–32)
Calcium: 9.6 mg/dL (ref 8.9–10.3)
Chloride: 103 mmol/L (ref 98–111)
Creatinine, Ser: 0.78 mg/dL (ref 0.44–1.00)
GFR calc Af Amer: >60 mL/min (ref >60)
GFR calc non Af Amer: >60 mL/min (ref >60)
Glucose, Bld: 98 mg/dL (ref 70–99)
Potassium: 3.8 mmol/L (ref 3.5–5.1)
Sodium: 141 mmol/L (ref 135–145)
Total Bilirubin: 2 mg/dL — ABNORMAL HIGH (ref 0.3–1.2)
Total Protein: 7.6 g/dL (ref 6.5–8.1)

## 2020-04-14 LAB — CBC WITH DIFFERENTIAL/PLATELET
Abs Immature Granulocytes: 0.03 10*3/uL (ref 0.00–0.07)
Basophils Absolute: 0 10*3/uL (ref 0.0–0.1)
Basophils Relative: 0 %
Eosinophils Absolute: 0 10*3/uL (ref 0.0–0.5)
Eosinophils Relative: 0 %
HCT: 40.8 % (ref 36.0–46.0)
Hemoglobin: 13.7 g/dL (ref 12.0–15.0)
Immature Granulocytes: 1 %
Lymphocytes Relative: 13 %
Lymphs Abs: 0.6 10*3/uL — ABNORMAL LOW (ref 0.7–4.0)
MCH: 31.3 pg (ref 26.0–34.0)
MCHC: 33.6 g/dL (ref 30.0–36.0)
MCV: 93.2 fL (ref 80.0–100.0)
Monocytes Absolute: 0.6 10*3/uL (ref 0.1–1.0)
Monocytes Relative: 12 %
Neutro Abs: 3.5 10*3/uL (ref 1.7–7.7)
Neutrophils Relative %: 74 %
Platelets: 218 10*3/uL (ref 150–400)
RBC: 4.38 MIL/uL (ref 3.87–5.11)
RDW: 13.7 % (ref 11.5–15.5)
WBC: 4.8 10*3/uL (ref 4.0–10.5)
nRBC: 0 % (ref 0.0–0.2)

## 2020-04-14 LAB — URINALYSIS, ROUTINE W REFLEX MICROSCOPIC
Bacteria, UA: NONE SEEN
Bilirubin Urine: NEGATIVE
Glucose, UA: NEGATIVE mg/dL
Ketones, ur: 20 mg/dL — AB
Nitrite: NEGATIVE
Protein, ur: 30 mg/dL — AB
Specific Gravity, Urine: 1.008 (ref 1.005–1.030)
pH: 6 (ref 5.0–8.0)

## 2020-04-14 LAB — PROCALCITONIN: Procalcitonin: <0.1 ng/mL

## 2020-04-14 LAB — D-DIMER, QUANTITATIVE: D-Dimer, Quant: 1.06 ug/mL-FEU — ABNORMAL HIGH (ref 0.00–0.50)

## 2020-04-14 LAB — SARS CORONAVIRUS 2 BY RT PCR (HOSPITAL ORDER, PERFORMED IN ~~LOC~~ HOSPITAL LAB): SARS Coronavirus 2: POSITIVE — AB

## 2020-04-14 LAB — TRIGLYCERIDES: Triglycerides: 160 mg/dL — ABNORMAL HIGH (ref <150)

## 2020-04-14 LAB — FERRITIN: Ferritin: 643 ng/mL — ABNORMAL HIGH (ref 11–307)

## 2020-04-14 LAB — FIBRINOGEN: Fibrinogen: 691 mg/dL — ABNORMAL HIGH (ref 210–475)

## 2020-04-14 LAB — LACTATE DEHYDROGENASE: LDH: 199 U/L — ABNORMAL HIGH (ref 98–192)

## 2020-04-14 LAB — C-REACTIVE PROTEIN: CRP: 6.9 mg/dL — ABNORMAL HIGH (ref <1.0)

## 2020-04-14 LAB — TROPONIN I (HIGH SENSITIVITY): Troponin I (High Sensitivity): 17 ng/L (ref <18)

## 2020-04-14 LAB — LACTIC ACID, PLASMA: Lactic Acid, Venous: 1.3 mmol/L (ref 0.5–1.9)

## 2020-04-14 MED ORDER — HYDRALAZINE HCL 20 MG/ML IJ SOLN
10.0000 mg | Freq: Three times a day (TID) | INTRAMUSCULAR | Status: DC | PRN
  Administered 2020-04-15: 10 mg via INTRAVENOUS
  Filled 2020-04-14: qty 1

## 2020-04-14 MED ORDER — LACTATED RINGERS IV BOLUS
1000.0000 mL | Freq: Once | INTRAVENOUS | Status: AC
  Administered 2020-04-14: 1000 mL via INTRAVENOUS

## 2020-04-14 NOTE — ED Provider Notes (Signed)
[facility] EMERGENCY DEPARTMENT Provider Note   CSN: [id number] Arrival date & time: 04/14/20  1752     History Chief Complaint  Patient presents with  . covid +    [name] is a 79 y.o. female.  HPI Patient presents with weakness decreased oral intake and difficulty managing at home.  Has been Covid positive for 13 days.  States she had decreased oral intake due to decreased appetite.  States she cannot taste food also.  Initially had some cough.  States that she had fevers up to 102 today when measured at home.  States she then took some Tylenol.  States she has trouble managing because she is feeling so weak.  Patient states she did not take her blood pressure medicine yesterday.  She is unvaccinated.    Past Medical History:  Diagnosis Date  . History of kidney stones   . Hypertension     There are no problems to display for this patient.   Past Surgical History:  Procedure Laterality Date  . CATARACT EXTRACTION W/PHACO Left 05/02/2019   Procedure: CATARACT EXTRACTION PHACO AND INTRAOCULAR LENS PLACEMENT (IOC);  Surgeon: Fabio Pierce, MD;  Location: [facility];  Service: Ophthalmology;  Laterality: Left;  CDE: 27.19  . CATARACT EXTRACTION W/PHACO Right 05/16/2019   Procedure: CATARACT EXTRACTION PHACO AND INTRAOCULAR LENS PLACEMENT RIGHT EYE;  Surgeon: Fabio Pierce, MD;  Location: [facility];  Service: Ophthalmology;  Laterality: Right;  CDE: 18.89  . CESAREAN SECTION       OB History   No obstetric history on file.     History reviewed. No pertinent family history.  Social History   Tobacco Use  . Smoking status: Former Games developer  . Smokeless tobacco: Never Used  Vaping Use  . Vaping Use: Never assessed  Substance Use Topics  . Alcohol use: No  . Drug use: Never    Home Medications Prior to Admission medications   Medication Sig Start Date End Date Taking? Authorizing Provider  ALPRAZolam (XANAX) 0.25 MG tablet Take 0.25 mg by mouth 2 (two) times  daily as needed for anxiety or sleep.  02/20/19   [provider]  atorvastatin (LIPITOR) 10 MG tablet Take 10 mg by mouth daily.     [provider]  carvedilol (COREG) 3.125 MG tablet Take 3.125 mg by mouth 2 (two) times daily with a meal.    [provider]  hydrALAZINE (APRESOLINE) 25 MG tablet Take 25 mg by mouth 3 (three) times daily. 03/03/19   [provider]  losartan (COZAAR) 50 MG tablet Take 100 mg by mouth daily.     [provider]  PRESCRIPTION MEDICATION Place 1 drop into the left eye 2 (two) times daily. OS-RX    [provider]    Allergies    Patient has no known allergies.  Review of Systems   Review of Systems  Constitutional: Positive for activity change, appetite change, fatigue and fever.  HENT: Negative for congestion.   Respiratory: Positive for cough and shortness of breath.   Cardiovascular: Negative for chest pain.  Gastrointestinal: Positive for nausea.  Genitourinary: Negative for flank pain.  Musculoskeletal: Negative for back pain.  Skin: Negative for rash.  Neurological: Negative for weakness.  Psychiatric/Behavioral: Negative for confusion.    Physical Exam Updated Vital Signs BP (!) 171/116   Pulse 94   Temp 98.5 F (36.9 C) (Oral)   Resp 18   Ht 5\' 10"  (1.778 m)   Wt 73.5 kg  SpO2 99%   BMI 23.24 kg/m   Physical Exam Vitals and nursing note reviewed.  HENT:     Head: Normocephalic.  Eyes:     General: No scleral icterus. Cardiovascular:     Rate and Rhythm: Regular rhythm.  Pulmonary:     Breath sounds: No wheezing, rhonchi or rales.  Abdominal:     Tenderness: There is no abdominal tenderness.  Musculoskeletal:     Cervical back: Neck supple.     Right lower leg: No edema.     Left lower leg: No edema.  Skin:    General: Skin is warm.     Capillary Refill: Capillary refill takes less than 2 seconds.  Neurological:     Mental Status: She is alert and oriented to person,  place, and time.     ED Results / Procedures / Treatments   Labs (all labs ordered are listed, but only abnormal results are displayed) Labs Reviewed  SARS CORONAVIRUS 2 BY RT PCR (HOSPITAL ORDER, PERFORMED IN [facility]) - Abnormal; Notable for the following components:      Result Value   SARS Coronavirus 2 POSITIVE (*)    All other components within normal limits  URINALYSIS, ROUTINE W REFLEX MICROSCOPIC - Abnormal; Notable for the following components:   Color, Urine STRAW (*)    Hgb urine dipstick SMALL (*)    Ketones, ur 20 (*)    Protein, ur 30 (*)    Leukocytes,Ua TRACE (*)    All other components within normal limits  CBC WITH DIFFERENTIAL/PLATELET - Abnormal; Notable for the following components:   Lymphs Abs 0.6 (*)    All other components within normal limits  COMPREHENSIVE METABOLIC PANEL - Abnormal; Notable for the following components:   Total Bilirubin 2.0 (*)    Anion gap 16 (*)    All other components within normal limits  D-DIMER, QUANTITATIVE (NOT AT [facility]) - Abnormal; Notable for the following components:   D-Dimer, Quant 1.06 (*)    All other components within normal limits  LACTATE DEHYDROGENASE - Abnormal; Notable for the following components:   LDH 199 (*)    All other components within normal limits  FERRITIN - Abnormal; Notable for the following components:   Ferritin 643 (*)    All other components within normal limits  TRIGLYCERIDES - Abnormal; Notable for the following components:   Triglycerides 160 (*)    All other components within normal limits  FIBRINOGEN - Abnormal; Notable for the following components:   Fibrinogen 691 (*)    All other components within normal limits  C-REACTIVE PROTEIN - Abnormal; Notable for the following components:   CRP 6.9 (*)    All other components within normal limits  CULTURE, BLOOD (ROUTINE X 2)  CULTURE, BLOOD (ROUTINE X 2)  LACTIC ACID, PLASMA  PROCALCITONIN  TROPONIN I (HIGH SENSITIVITY)      EKG EKG Interpretation  Date/Time:  Saturday April 14 2020 18:08:58 EDT Ventricular Rate:  97 PR Interval:    QRS Duration: 86 QT Interval:  352 QTC Calculation: 448 R Axis:   37 Text Interpretation: Sinus rhythm Probable left atrial enlargement Minimal ST depression, inferior leads Minimal ST elevation, lateral leads No significant change since last tracing Confirmed by Benjiman Core [phone]) on 04/14/2020 10:47:54 PM   Radiology DG Chest Port 1 View  Result Date: 04/14/2020 CLINICAL DATA:  COVID-19 positive. EXAM: PORTABLE CHEST 1 VIEW COMPARISON:  October 15, 2017 FINDINGS: Cardiomediastinal silhouette is normal. Mediastinal contours  appear intact. Tortuosity and calcific atherosclerotic disease of the aorta. Mild patchy bilateral airspace consolidation with peripheral predominance. Osseous structures are without acute abnormality. Soft tissues are grossly normal. IMPRESSION: Patchy bilateral airspace consolidation with peripheral predominance, most consistent with early or mild atypical/viral pneumonia. Electronically Signed   By: Ted Mcalpine M.D.   On: 04/14/2020 19:00    Procedures Procedures (including critical care time)  Medications Ordered in ED Medications  lactated ringers bolus 1,000 mL (0 mLs Intravenous Stopped 04/14/20 2016)    ED Course  I have reviewed the triage vital signs and the nursing notes.  Pertinent labs & imaging results that were available during my care of the patient were reviewed by me and considered in my medical decision making (see chart for details).    MDM Rules/Calculators/A&P                          Patient presents with fatigue and decreased oral intake since being diagnosed with Covid.  X-ray shows Covid pneumonia.  Not hypoxic however is very fatigued.  States not being able to eat and drink and really take care of herself at home.  States she would not be able to walk out to the car.  With age and a severe amount of  fatigue I feels the patient may benefit from admission to the hospital for further IV fluids and help arranging potentially further help at home.  Will discuss with hospitalist. Final Clinical Impression(s) / ED Diagnoses Final diagnoses:  COVID-19    Rx / DC Orders ED Discharge Orders    None       Benjiman Core, MD 04/14/20 2249

## 2020-04-14 NOTE — ED Triage Notes (Signed)
Pt c/o of weakness, decreased appetite.  been coivd + for 13 days.

## 2020-04-15 DIAGNOSIS — Z9841 Cataract extraction status, right eye: Secondary | ICD-10-CM | POA: Diagnosis not present

## 2020-04-15 DIAGNOSIS — J1282 Pneumonia due to coronavirus disease 2019: Secondary | ICD-10-CM

## 2020-04-15 DIAGNOSIS — R531 Weakness: Secondary | ICD-10-CM

## 2020-04-15 DIAGNOSIS — R630 Anorexia: Secondary | ICD-10-CM | POA: Diagnosis present

## 2020-04-15 DIAGNOSIS — U071 COVID-19: Secondary | ICD-10-CM | POA: Diagnosis present

## 2020-04-15 DIAGNOSIS — Z6820 Body mass index (BMI) 20.0-20.9, adult: Secondary | ICD-10-CM | POA: Diagnosis not present

## 2020-04-15 DIAGNOSIS — Z79899 Other long term (current) drug therapy: Secondary | ICD-10-CM | POA: Diagnosis not present

## 2020-04-15 DIAGNOSIS — I169 Hypertensive crisis, unspecified: Secondary | ICD-10-CM

## 2020-04-15 DIAGNOSIS — Z87891 Personal history of nicotine dependence: Secondary | ICD-10-CM | POA: Diagnosis not present

## 2020-04-15 DIAGNOSIS — I10 Essential (primary) hypertension: Secondary | ICD-10-CM | POA: Diagnosis present

## 2020-04-15 DIAGNOSIS — Z87442 Personal history of urinary calculi: Secondary | ICD-10-CM | POA: Diagnosis not present

## 2020-04-15 DIAGNOSIS — Z8249 Family history of ischemic heart disease and other diseases of the circulatory system: Secondary | ICD-10-CM | POA: Diagnosis not present

## 2020-04-15 DIAGNOSIS — Z961 Presence of intraocular lens: Secondary | ICD-10-CM | POA: Diagnosis present

## 2020-04-15 DIAGNOSIS — Z9842 Cataract extraction status, left eye: Secondary | ICD-10-CM | POA: Diagnosis not present

## 2020-04-15 LAB — GLUCOSE, CAPILLARY
Glucose-Capillary: 137 mg/dL — ABNORMAL HIGH (ref 70–99)
Glucose-Capillary: 157 mg/dL — ABNORMAL HIGH (ref 70–99)
Glucose-Capillary: 159 mg/dL — ABNORMAL HIGH (ref 70–99)

## 2020-04-15 MED ORDER — HYDRALAZINE HCL 25 MG PO TABS
25.0000 mg | ORAL_TABLET | Freq: Three times a day (TID) | ORAL | Status: DC
  Administered 2020-04-15 – 2020-04-16 (×4): 25 mg via ORAL
  Filled 2020-04-15 (×4): qty 1

## 2020-04-15 MED ORDER — DIPHENHYDRAMINE HCL 25 MG PO CAPS: 50.0000 mg | ORAL_CAPSULE | Freq: Every evening | ORAL | Status: DC | PRN

## 2020-04-15 MED ORDER — MIRTAZAPINE 15 MG PO TABS
7.5000 mg | ORAL_TABLET | Freq: Every day | ORAL | Status: DC
  Administered 2020-04-15: 7.5 mg via ORAL
  Filled 2020-04-15: qty 1

## 2020-04-15 MED ORDER — MIRTAZAPINE 15 MG PO TABS: 7.5000 mg | ORAL_TABLET | Freq: Every day | ORAL | Status: DC

## 2020-04-15 MED ORDER — INSULIN ASPART 100 UNIT/ML ~~LOC~~ SOLN: 0.0000 [IU] | Freq: Every day | SUBCUTANEOUS | Status: DC

## 2020-04-15 MED ORDER — CARVEDILOL 3.125 MG PO TABS
3.1250 mg | ORAL_TABLET | Freq: Two times a day (BID) | ORAL | Status: DC
  Administered 2020-04-15 – 2020-04-16 (×3): 3.125 mg via ORAL
  Filled 2020-04-15 (×3): qty 1

## 2020-04-15 MED ORDER — SODIUM CHLORIDE 0.9 % IV SOLN: INTRAVENOUS | Status: DC

## 2020-04-15 MED ORDER — METHYLPREDNISOLONE SODIUM SUCC 40 MG IJ SOLR
40.0000 mg | Freq: Two times a day (BID) | INTRAMUSCULAR | Status: DC
  Administered 2020-04-15 – 2020-04-16 (×3): 40 mg via INTRAVENOUS
  Filled 2020-04-15 (×3): qty 1

## 2020-04-15 MED ORDER — PANTOPRAZOLE SODIUM 40 MG PO TBEC
40.0000 mg | DELAYED_RELEASE_TABLET | Freq: Every day | ORAL | Status: DC
  Administered 2020-04-16: 40 mg via ORAL
  Filled 2020-04-15 (×2): qty 1

## 2020-04-15 MED ORDER — ONDANSETRON HCL 4 MG PO TABS: 4.0000 mg | ORAL_TABLET | Freq: Four times a day (QID) | ORAL | Status: DC | PRN

## 2020-04-15 MED ORDER — ATORVASTATIN CALCIUM 10 MG PO TABS
10.0000 mg | ORAL_TABLET | Freq: Every day | ORAL | Status: DC
  Administered 2020-04-15 – 2020-04-16 (×2): 10 mg via ORAL
  Filled 2020-04-15 (×3): qty 1

## 2020-04-15 MED ORDER — ZINC SULFATE 220 (50 ZN) MG PO CAPS
220.0000 mg | ORAL_CAPSULE | Freq: Every day | ORAL | Status: DC
  Administered 2020-04-15 – 2020-04-16 (×2): 220 mg via ORAL
  Filled 2020-04-15 (×3): qty 1

## 2020-04-15 MED ORDER — INSULIN ASPART 100 UNIT/ML ~~LOC~~ SOLN
0.0000 [IU] | Freq: Three times a day (TID) | SUBCUTANEOUS | Status: DC
  Administered 2020-04-15: 1 [IU] via SUBCUTANEOUS
  Administered 2020-04-16: 2 [IU] via SUBCUTANEOUS
  Administered 2020-04-16: 1 [IU] via SUBCUTANEOUS

## 2020-04-15 MED ORDER — SODIUM CHLORIDE 0.9 % IV SOLN: 100.0000 mg | Freq: Every day | INTRAVENOUS | Status: DC

## 2020-04-15 MED ORDER — MEGESTROL ACETATE 400 MG/10ML PO SUSP: 400.0000 mg | Freq: Two times a day (BID) | ORAL | Status: DC

## 2020-04-15 MED ORDER — ONDANSETRON HCL 4 MG/2ML IJ SOLN
4.0000 mg | Freq: Four times a day (QID) | INTRAMUSCULAR | Status: DC | PRN
  Administered 2020-04-15: 4 mg via INTRAVENOUS
  Filled 2020-04-15: qty 2

## 2020-04-15 MED ORDER — ASCORBIC ACID 500 MG PO TABS
500.0000 mg | ORAL_TABLET | Freq: Every day | ORAL | Status: DC
  Administered 2020-04-15 – 2020-04-16 (×2): 500 mg via ORAL
  Filled 2020-04-15 (×3): qty 1

## 2020-04-15 MED ORDER — LOSARTAN POTASSIUM 50 MG PO TABS
100.0000 mg | ORAL_TABLET | Freq: Every day | ORAL | Status: DC
  Administered 2020-04-15: 100 mg via ORAL
  Filled 2020-04-15: qty 2

## 2020-04-15 MED ORDER — GUAIFENESIN-DM 100-10 MG/5ML PO SYRP: 10.0000 mL | ORAL_SOLUTION | ORAL | Status: DC | PRN

## 2020-04-15 MED ORDER — HEPARIN SODIUM (PORCINE) 5000 UNIT/ML IJ SOLN
5000.0000 [IU] | Freq: Three times a day (TID) | INTRAMUSCULAR | Status: DC
  Administered 2020-04-15 – 2020-04-16 (×4): 5000 [IU] via SUBCUTANEOUS
  Filled 2020-04-15 (×4): qty 1

## 2020-04-15 MED ORDER — POLYETHYLENE GLYCOL 3350 17 G PO PACK: 17.0000 g | PACK | Freq: Every day | ORAL | Status: DC | PRN

## 2020-04-15 MED ORDER — ENSURE ENLIVE PO LIQD
237.0000 mL | Freq: Two times a day (BID) | ORAL | Status: DC
  Administered 2020-04-16: 237 mL via ORAL

## 2020-04-15 MED ORDER — ALPRAZOLAM 0.25 MG PO TABS
0.2500 mg | ORAL_TABLET | Freq: Two times a day (BID) | ORAL | Status: DC | PRN
  Administered 2020-04-15: 0.25 mg via ORAL
  Filled 2020-04-15 (×2): qty 1

## 2020-04-15 MED ORDER — ALBUTEROL SULFATE HFA 108 (90 BASE) MCG/ACT IN AERS: 2.0000 | INHALATION_SPRAY | Freq: Four times a day (QID) | RESPIRATORY_TRACT | Status: DC | PRN

## 2020-04-15 MED ORDER — HYDROCOD POLST-CPM POLST ER 10-8 MG/5ML PO SUER: 5.0000 mL | Freq: Two times a day (BID) | ORAL | Status: DC | PRN

## 2020-04-15 MED ORDER — SODIUM CHLORIDE 0.9 % IV SOLN: 200.0000 mg | Freq: Once | INTRAVENOUS | Status: DC

## 2020-04-15 NOTE — Progress Notes (Signed)
Patient Demographics:    [name], is a 79 y.o. female, DOB - 11-Dec-1940, HDQ:[id number]  Admit date - 04/14/2020   Admitting Physician [place] Oborn Mariea Clonts, MD  Outpatient Primary MD for the patient is Assunta Found, MD  LOS - 0   Chief Complaint  Patient presents with  . covid +        Subjective:    [name] today has fevers, nausea, and anorexia -No significant hypoxia -Patient with slight cough  Assessment  & Plan :    Principal Problem:   Pneumonia due to COVID-19 virus Active Problems:   Acute respiratory disease due to COVID-19 virus   Generalized weakness   Anorexia--- Covid related   Brief Summary:- 79 y.o. female, with history of hypertension, history of nephrolithiasis--admitted on 04/15/20 with concerns about dehydration in the setting of anorexia and persistent nausea secondary to COVID-19 infection -Patient is not vaccinated against COVID-19 infection COVID-19 infection symptoms started on 04/02/2020 several days after attending the funeral   A/p 1)-- COVID-19 infection/Pneumonia--- cxr consistent with Covid PNA ---The treatment plan and use of medications  for treatment of COVID-19 infection and possible side effects were discussed with patient/family -----Patient/Family verbalizes understanding and agrees to treatment protocols   --Patient is positive for COVID-19 infection, chest x-ray with findings of infiltrates/opacities,   --Check and trend inflammatory markers including D-dimer, ferritin and  CRP---also follow CBC and CMP --Supplemental oxygen as needed to keep O2 sats above 93% -Follow serial chest x-rays and ABGs as indicated --- Encourage prone positioning for More than 16 hours/day in increments of 2 to 3 hours at a time if able to tolerate --Attempt to maintain euvolemic state --Zinc and vitamin C as ordered -Albuterol inhaler as  needed -Accu-Cheks/fingersticks while on high-dose steroids -PPI while on high-dose steroids - anticoagulant for DVT prophylaxis  - Treat with Remdesevir and Solu-medrol started on 04/15/20 - COVID-19 Labs  Recent Labs    04/14/20 1850  DDIMER 1.06*  FERRITIN 643*  LDH 199*  CRP 6.9*    Lab Results  Component Value Date   SARSCOV2NAA POSITIVE (A) 04/14/2020   SARSCOV2NAA NEGATIVE 05/12/2019   SARSCOV2NAA NEGATIVE 04/29/2019    2)HTN--stage II, hold losartan due to concerns about dehydration and AKI -Hydralazine as needed as ordered  3) anorexia----appetite remains poor secondary to COVID-19 infection, give Megace for appetite stimulation -IV fluids as ordered  4)Generalized Weakness and deconditioning-PT recommends SNF rehab family considering possible discharge home with home health there will need rolling walker and bedside commode  Disposition/Need for in-Hospital Stay- patient unable to be discharged at this time due to --COVID-19 pneumonia requiring IV steroids and IV remdesivir  -poor oral intake requiring IV fluids in the setting of COVID-19 infection -Awaiting decision on possible transfer to SNF rehab as recommended by physical therapist  Status is: Inpatient  Remains inpatient appropriate because:COVID-19 pneumonia requiring IV steroids and IV remdesivir    Disposition: The patient is from: Home              Anticipated d/c is to: Home with HH Vs SNF               Anticipated d/c date is: 2 days  Patient currently is not medically stable to d/c. Barriers: Not Clinically Stable- COVID-19 pneumonia requiring IV steroids and IV remdesivir  -poor oral intake requiring IV fluids in the setting of COVID-19 infection -Awaiting decision on possible transfer to SNF rehab as recommended by physical therapist  Code Status : full   Family Communication:   Discussed with Spouse/ and Grand-daughter - Ms Amia Rynders   Consults  :  na  DVT Prophylaxis   :   - Heparin - SCDs   Lab Results  Component Value Date   PLT 218 04/14/2020    Inpatient Medications  Scheduled Meds: . vitamin C  500 mg Oral Daily  . atorvastatin  10 mg Oral Daily  . carvedilol  3.125 mg Oral BID WC  . feeding supplement (ENSURE ENLIVE)  237 mL Oral BID BM  . heparin  5,000 Units Subcutaneous Q8H  . hydrALAZINE  25 mg Oral TID  . insulin aspart  0-5 Units Subcutaneous QHS  . insulin aspart  0-6 Units Subcutaneous TID WC  . methylPREDNISolone (SOLU-MEDROL) injection  40 mg Intravenous Q12H  . mirtazapine  7.5 mg Oral QHS  . zinc sulfate  220 mg Oral Daily   Continuous Infusions: . sodium chloride 50 mL/hr at 04/15/20 1157   PRN Meds:.albuterol, ALPRAZolam, chlorpheniramine-HYDROcodone, diphenhydrAMINE, guaiFENesin-dextromethorphan, hydrALAZINE, ondansetron **OR** ondansetron (ZOFRAN) IV, polyethylene glycol    Anti-infectives (From admission, onward)   None        Objective:   Vitals:   04/15/20 0337 04/15/20 0615 04/15/20 1005 04/15/20 1415  BP: 129/74 (!) 141/65 124/78 128/85  Pulse:  100 94 98  Resp:  16 16 18   Temp:  98.9 F (37.2 C) 98.3 F (36.8 C) 98.2 F (36.8 C)  TempSrc:  Oral Oral Oral  SpO2:  96% 98% 96%  Weight:      Height:        Wt Readings from Last 3 Encounters:  04/15/20 65.8 kg  05/02/19 73.5 kg  09/09/18 74.4 kg    Intake/Output Summary (Last 24 hours) at 04/15/2020 1546 Last data filed at 04/15/2020 1157 Gross per 24 hour  Intake 571.84 ml  Output 200 ml  Net 371.84 ml   Physical Exam  Gen:- Awake Alert, ill-appearing HEENT:- .AT, No sclera icterus Neck-Supple Neck,No JVD,.  Lungs-diminished breath sounds with scattered rhonchi CV- S1, S2 normal, regular  Abd-  +ve B.Sounds, Abd Soft, No tenderness,    Extremity/Skin:- No  edema, pedal pulses present  Psych-affect is flat, oriented x3 Neuro-generalized weakness, no new focal deficits, no tremors   Data Review:   Micro Results Recent Results (from  the past 240 hour(s))  SARS Coronavirus 2 by RT PCR (hospital order, performed in [facility] hospital lab) Nasopharyngeal Nasopharyngeal Swab     Status: Abnormal   Collection Time: 04/14/20  6:29 PM   Specimen: Nasopharyngeal Swab  Result Value Ref Range Status   SARS Coronavirus 2 POSITIVE (A) NEGATIVE Final    Comment: RESULT CALLED TO, READ BACK BY AND VERIFIED WITH: [place],C @ 2017 ON 04/14/20 BY JUW (NOTE) SARS-CoV-2 target nucleic acids are DETECTED  SARS-CoV-2 RNA is generally detectable in upper respiratory specimens  during the acute phase of infection.  Positive results are indicative  of the presence of the identified virus, but do not rule out bacterial infection or co-infection with other pathogens not detected by the test.  Clinical correlation with patient history and  other diagnostic information is necessary to determine patient infection status.  The  expected result is negative.  Fact Sheet for Patients:   BoilerBrush.com.cy   Fact Sheet for Healthcare Providers:   https://pope.com/    This test is not yet approved or cleared by the [place] FDA and  has been authorized for detection and/or diagnosis of SARS-CoV-2 by FDA under an Emergency Use Authorization (EUA).  This EUA will remain in effect (meaning this  test can be used) for the duration of  the COVID-19 declaration under Section 564(b)(1) of the Act, 21 U.S.C. section 360-bbb-3(b)(1), unless the authorization is terminated or revoked sooner.  Performed at [facility], [address], [place], [place] [address]   Blood Culture (routine x 2)     Status: None (Preliminary result)   Collection Time: 04/14/20  7:35 PM   Specimen: Right Antecubital; Blood  Result Value Ref Range Status   Specimen Description RIGHT ANTECUBITAL  Final   Special Requests   Final    BOTTLES DRAWN AEROBIC AND ANAEROBIC Blood Culture adequate volume   Culture   Final     NO GROWTH < 24 HOURS Performed at [facility], [address], [place], [place] [address]    Report Status PENDING  Incomplete  Blood Culture (routine x 2)     Status: None (Preliminary result)   Collection Time: 04/14/20  7:40 PM   Specimen: BLOOD RIGHT HAND  Result Value Ref Range Status   Specimen Description BLOOD RIGHT HAND  Final   Special Requests   Final    BOTTLES DRAWN AEROBIC AND ANAEROBIC Blood Culture adequate volume   Culture   Final    NO GROWTH < 24 HOURS Performed at [facility], [address], [place], [place] [address]    Report Status PENDING  Incomplete    Radiology Reports DG Chest Port 1 View  Result Date: 04/14/2020 CLINICAL DATA:  COVID-19 positive. EXAM: PORTABLE CHEST 1 VIEW COMPARISON:  October 15, 2017 FINDINGS: Cardiomediastinal silhouette is normal. Mediastinal contours appear intact. Tortuosity and calcific atherosclerotic disease of the aorta. Mild patchy bilateral airspace consolidation with peripheral predominance. Osseous structures are without acute abnormality. Soft tissues are grossly normal. IMPRESSION: Patchy bilateral airspace consolidation with peripheral predominance, most consistent with early or mild atypical/viral pneumonia. Electronically Signed   By: Ted Mcalpine M.D.   On: 04/14/2020 19:00     CBC Recent Labs  Lab 04/14/20 1850  WBC 4.8  HGB 13.7  HCT 40.8  PLT 218  MCV 93.2  MCH 31.3  MCHC 33.6  RDW 13.7  LYMPHSABS 0.6*  MONOABS 0.6  EOSABS 0.0  BASOSABS 0.0    Chemistries  Recent Labs  Lab 04/14/20 1850  NA 141  K 3.8  CL 103  CO2 22  GLUCOSE 98  BUN 16  CREATININE 0.78  CALCIUM 9.6  AST 38  ALT 40  ALKPHOS 60  BILITOT 2.0*   ------------------------------------------------------------------------------------------------------------------ Recent Labs    04/14/20 1850  TRIG 160*    Lab Results  Component Value Date   HGBA1C 6.2 (H) 05/13/2011    ------------------------------------------------------------------------------------------------------------------ No results for input(s): TSH, T4TOTAL, T3FREE, THYROIDAB in the last 72 hours.  Invalid input(s): FREET3 ------------------------------------------------------------------------------------------------------------------ Recent Labs    04/14/20 1850  FERRITIN 643*    Coagulation profile No results for input(s): INR, PROTIME in the last 168 hours.  Recent Labs    04/14/20 1850  DDIMER 1.06*    Cardiac Enzymes No results for input(s): CKMB, TROPONINI, MYOGLOBIN in the last 168 hours.  Invalid input(s): CK ------------------------------------------------------------------------------------------------------------------ No results  found for: BNP   Shon Haleourage Nikka Hakimian M.D on 04/15/2020 at 3:46 PM  Go to [email or web address] - for contact info  [facility] - Office  [phone]

## 2020-04-15 NOTE — Plan of Care (Signed)
  Problem: Acute Rehab PT Goals(only PT should resolve) Goal: Patient Will Transfer Sit To/From Stand Flowsheets (Taken 04/15/2020 1543) Patient will transfer sit to/from stand: with modified independence Goal: Pt Will Transfer Bed To Chair/Chair To Bed Flowsheets (Taken 04/15/2020 1543) Pt will Transfer Bed to Chair/Chair to Bed: with modified independence Goal: Pt Will Ambulate Flowsheets (Taken 04/15/2020 1543) Pt will Ambulate:  75 feet  with rolling walker  with modified independence Goal: Pt Will Go Up/Down Stairs Flowsheets (Taken 04/15/2020 1543) Pt will Go Up / Down Stairs:  6-9 stairs  with modified independence  with rail(s) Goal: Pt/caregiver will Perform Home Exercise Program Flowsheets (Taken 04/15/2020 1543) Pt/caregiver will Perform Home Exercise Program:  For increased strengthening  For improved balance  Independently   3:44 PM, 04/15/20 Georges Lynch PT DPT  Physical Therapist with [facility]  [phone]

## 2020-04-15 NOTE — H&P (Signed)
TRH H&P    Patient Demographics:    [name], is a 79 y.o. female  MRN: [id number]  DOB - 1940-08-27  Admit Date - 04/14/2020  Referring MD/NP/PA: Rubin Payor  Outpatient Primary MD for the patient is Assunta Found, MD  Patient coming from: Home  Chief complaint-generalized weakness  HPI:    [name]  is a 79 y.o. female, with history of hypertension, history of nephrolithiasis, presents to the ED with a chief complaint of generalized weakness.  Patient reports that she is on her 13th day of symptoms from COVID-19.  She reports that she is had a fever as high as 102 today.  She reports that she is taking Tylenol for this fever and that is why she does not have a fever at presentation to the ED.  She reports that she has been weak ever since she was diagnosed with Covid.  She has a cough that has no sputum production.  She reports that she has no body aches.  She does have a decreased appetite, loss of sense of smell, and loss of sense of taste.  Patient reports her last normal meal was somewhere between a week and 2 weeks ago.  She reports that she has not been taking her medications because she is not feeling well enough to take them, and is also not eating so does not want to take on empty stomach.  Patient reports no travel.  She reports that she went to a funeral and that is where she had a sick contact with Covid.  Patient reports that she is not vaccinated because she is 79 years old and did not think it was necessary however she would like to be full code.  Patient reports that she has had intractable nausea.  Now she is too weak to even walk around.  Patient came into the ER today because she feels like she is unable to take care of herself and she is this week.  In the ED Temperature 98.5, heart rate 94, respiratory 18, blood pressure at this time is 171/116, saturating at 98% on room air Blood pressure  was as high as 202/98 White blood cell count 4.8 T bili elevated at 2.0 Other inflammatory markers are listed below in the plan Chest x-ray shows bilateral airspace consolidations most consistent with early or mild atypical pneumonia 1 L bolus was given in the ED     Review of systems:    In addition to the HPI above,  Review of Systems  Constitutional: Positive for chills, fever, malaise/fatigue and weight loss.  HENT: Negative for congestion, sinus pain and sore throat.   Eyes: Negative for blurred vision, pain, discharge and redness.  Respiratory: Positive for cough. Negative for sputum production, shortness of breath and wheezing.   Cardiovascular: Negative for chest pain, palpitations and leg swelling.  Gastrointestinal: Positive for diarrhea and nausea. Negative for abdominal pain, constipation and vomiting.  Genitourinary: Negative for dysuria, frequency and urgency.  Musculoskeletal: Negative for falls and myalgias.  Skin: Negative for itching and rash.  Neurological: Positive for weakness. Negative for dizziness, focal weakness and headaches.  Endo/Heme/Allergies: Negative for polydipsia.  Psychiatric/Behavioral: Negative for substance abuse.    All other systems reviewed and are negative.    Past History of the following :    Past Medical History:  Diagnosis Date  . History of kidney stones   . Hypertension       Past Surgical History:  Procedure Laterality Date  . CATARACT EXTRACTION W/PHACO Left 05/02/2019   Procedure: CATARACT EXTRACTION PHACO AND INTRAOCULAR LENS PLACEMENT (IOC);  Surgeon: Fabio Pierce, MD;  Location: [facility];  Service: Ophthalmology;  Laterality: Left;  CDE: 27.19  . CATARACT EXTRACTION W/PHACO Right 05/16/2019   Procedure: CATARACT EXTRACTION PHACO AND INTRAOCULAR LENS PLACEMENT RIGHT EYE;  Surgeon: Fabio Pierce, MD;  Location: [facility];  Service: Ophthalmology;  Laterality: Right;  CDE: 18.89  . CESAREAN SECTION        Social  History:      Social History   Tobacco Use  . Smoking status: Former Games developer  . Smokeless tobacco: Never Used  Substance Use Topics  . Alcohol use: No       Family History :    History reviewed. No pertinent family history. Family history of hypertension   Home Medications:   Prior to Admission medications   Medication Sig Start Date End Date Taking? Authorizing Provider  ALPRAZolam (XANAX) 0.25 MG tablet Take 0.25 mg by mouth 2 (two) times daily as needed for anxiety or sleep.  02/20/19   [provider]  atorvastatin (LIPITOR) 10 MG tablet Take 10 mg by mouth daily.     [provider]  carvedilol (COREG) 3.125 MG tablet Take 3.125 mg by mouth 2 (two) times daily with a meal.    [provider]  hydrALAZINE (APRESOLINE) 25 MG tablet Take 25 mg by mouth 3 (three) times daily. 03/03/19   [provider]  losartan (COZAAR) 50 MG tablet Take 100 mg by mouth daily.     [provider]  PRESCRIPTION MEDICATION Place 1 drop into the left eye 2 (two) times daily. OS-RX    [provider]     Allergies:    No Known Allergies   Physical Exam:   Vitals  Blood pressure (!) 187/93, pulse 90, temperature 98.5 F (36.9 C), temperature source Oral, resp. rate 18, height 5\' 10"  (1.778 m), weight 73.5 kg, SpO2 95 %.  1.  General: Lying supine in bed in no acute distress  2. Psychiatric: Irritable, cooperative with exam  3. Neurologic: Cranial nerves II through XII are intact, moves all 4 extremities voluntarily, equal sensation in the upper and lower extremities bilaterally  4. HEENMT:  Head is atraumatic, normocephalic, mucous membranes are dry, trachea is midline, neck is cachectic  5. Respiratory : Lungs are diminished in the lower lung fields, no wheeze, no rhonchi, no rales  6. Cardiovascular : Heart rate is tachycardic, rhythm is regular, systolic murmur is present, peripheral pulses are palpated  7. Gastrointestinal:   Abdomen is soft, nondistended, nontender to palpation  8. Skin:  No acute lesions on limited skin exam  9.Musculoskeletal:  No acute deformities or peripheral edema    Data Review:    CBC Recent Labs  Lab 04/14/20 1850  WBC 4.8  HGB 13.7  HCT 40.8  PLT 218  MCV 93.2  MCH 31.3  MCHC 33.6  RDW 13.7  LYMPHSABS 0.6*  MONOABS 0.6  EOSABS 0.0  BASOSABS 0.0   ------------------------------------------------------------------------------------------------------------------  Results  for orders placed or performed during the hospital encounter of 04/14/20 (from the past 48 hour(s))  Urinalysis, Routine w reflex microscopic Urine, Clean Catch     Status: Abnormal   Collection Time: 04/14/20  6:28 PM  Result Value Ref Range   Color, Urine STRAW (A) YELLOW   APPearance CLEAR CLEAR   Specific Gravity, Urine 1.008 1.005 - 1.030   pH 6.0 5.0 - 8.0   Glucose, UA NEGATIVE NEGATIVE mg/dL   Hgb urine dipstick SMALL (A) NEGATIVE   Bilirubin Urine NEGATIVE NEGATIVE   Ketones, ur 20 (A) NEGATIVE mg/dL   Protein, ur 30 (A) NEGATIVE mg/dL   Nitrite NEGATIVE NEGATIVE   Leukocytes,Ua TRACE (A) NEGATIVE   RBC / HPF 0-5 0 - 5 RBC/hpf   WBC, UA 0-5 0 - 5 WBC/hpf   Bacteria, UA NONE SEEN NONE SEEN   Squamous Epithelial / LPF 0-5 0 - 5    Comment: Performed at [facility], [address], [place], [place] [address]  SARS Coronavirus 2 by RT PCR (hospital order, performed in [facility] hospital lab) Nasopharyngeal Nasopharyngeal Swab     Status: Abnormal   Collection Time: 04/14/20  6:29 PM   Specimen: Nasopharyngeal Swab  Result Value Ref Range   SARS Coronavirus 2 POSITIVE (A) NEGATIVE    Comment: RESULT CALLED TO, READ BACK BY AND VERIFIED WITH: [place],C @ 2017 ON 04/14/20 BY JUW (NOTE) SARS-CoV-2 target nucleic acids are DETECTED  SARS-CoV-2 RNA is generally detectable in upper respiratory specimens  during the acute phase of infection.  Positive results are indicative  of the  presence of the identified virus, but do not rule out bacterial infection or co-infection with other pathogens not detected by the test.  Clinical correlation with patient history and  other diagnostic information is necessary to determine patient infection status.  The expected result is negative.  Fact Sheet for Patients:   BoilerBrush.com.cy   Fact Sheet for Healthcare Providers:   https://pope.com/    This test is not yet approved or cleared by the [place] FDA and  has been authorized for detection and/or diagnosis of SARS-CoV-2 by FDA under an Emergency Use Authorization (EUA).  This EUA will remain in effect (meaning this  test can be used) for the duration of  the COVID-19 declaration under Section 564(b)(1) of the Act, 21 U.S.C. section 360-bbb-3(b)(1), unless the authorization is terminated or revoked sooner.  Performed at [facility], [address], [place], [place] [address]   Lactic acid, plasma     Status: None   Collection Time: 04/14/20  6:50 PM  Result Value Ref Range   Lactic Acid, Venous 1.3 0.5 - 1.9 mmol/L    Comment: Performed at [facility], [address] [place], [place] [address]  CBC WITH DIFFERENTIAL     Status: Abnormal   Collection Time: 04/14/20  6:50 PM  Result Value Ref Range   WBC 4.8 4.0 - 10.5 K/uL   RBC 4.38 3.87 - 5.11 MIL/uL   Hemoglobin 13.7 12.0 - 15.0 g/dL   HCT 50.9 36 - 46 %   MCV 93.2 80.0 - 100.0 fL   MCH 31.3 26.0 - 34.0 pg   MCHC 33.6 30.0 - 36.0 g/dL   RDW 32.6 71.2 - 45.8 %   Platelets 218 150 - 400 K/uL   nRBC 0.0 0.0 - 0.2 %   Neutrophils Relative % 74 %   Neutro Abs 3.5 1.7 - 7.7 K/uL   Lymphocytes Relative 13 %  Lymphs Abs 0.6 (L) 0.7 - 4.0 K/uL   Monocytes Relative 12 %   Monocytes Absolute 0.6 0 - 1 K/uL   Eosinophils Relative 0 %   Eosinophils Absolute 0.0 0 - 0 K/uL   Basophils Relative 0 %   Basophils Absolute 0.0 0 - 0 K/uL   Immature Granulocytes 1 %    Abs Immature Granulocytes 0.03 0.00 - 0.07 K/uL    Comment: Performed at [facility], [address], [place], [place] [address]  Comprehensive metabolic panel     Status: Abnormal   Collection Time: 04/14/20  6:50 PM  Result Value Ref Range   Sodium 141 135 - 145 mmol/L   Potassium 3.8 3.5 - 5.1 mmol/L   Chloride 103 98 - 111 mmol/L   CO2 22 22 - 32 mmol/L   Glucose, Bld 98 70 - 99 mg/dL    Comment: Glucose reference range applies only to samples taken after fasting for at least 8 hours.   BUN 16 8 - 23 mg/dL   Creatinine, Ser 6.04 0.44 - 1.00 mg/dL   Calcium 9.6 8.9 - 54.0 mg/dL   Total Protein 7.6 6.5 - 8.1 g/dL   Albumin 3.8 3.5 - 5.0 g/dL   AST 38 15 - 41 U/L   ALT 40 0 - 44 U/L   Alkaline Phosphatase 60 38 - 126 U/L   Total Bilirubin 2.0 (H) 0.3 - 1.2 mg/dL   GFR calc non Af Amer >60 >60 mL/min   GFR calc Af Amer >60 >60 mL/min   Anion gap 16 (H) 5 - 15    Comment: Performed at [facility], [address] [place], [place] [address]  D-dimer, quantitative     Status: Abnormal   Collection Time: 04/14/20  6:50 PM  Result Value Ref Range   D-Dimer, Quant 1.06 (H) 0.00 - 0.50 ug/mL-FEU    Comment: (NOTE) At the manufacturer cut-off of 0.50 ug/mL FEU, this assay has been documented to exclude PE with a sensitivity and negative predictive value of 97 to 99%.  At this time, this assay has not been approved by the FDA to exclude DVT/VTE. Results should be correlated with clinical presentation. Performed at [facility], [address], [place], [place] [address]   Procalcitonin     Status: None   Collection Time: 04/14/20  6:50 PM  Result Value Ref Range   Procalcitonin <0.10 ng/mL    Comment:        Interpretation: PCT (Procalcitonin) <= 0.5 ng/mL: Systemic infection (sepsis) is not likely. Local bacterial infection is possible. (NOTE)       Sepsis PCT Algorithm           Lower Respiratory Tract                                      Infection PCT Algorithm     ----------------------------     ----------------------------         PCT < 0.25 ng/mL                PCT < 0.10 ng/mL          Strongly encourage             Strongly discourage   discontinuation of antibiotics    initiation of antibiotics    ----------------------------     -----------------------------       PCT 0.25 - 0.50  ng/mL            PCT 0.10 - 0.25 ng/mL               OR       >80% decrease in PCT            Discourage initiation of                                            antibiotics      Encourage discontinuation           of antibiotics    ----------------------------     -----------------------------         PCT >= 0.50 ng/mL              PCT 0.26 - 0.50 ng/mL               AND        <80% decrease in PCT             Encourage initiation of                                             antibiotics       Encourage continuation           of antibiotics    ----------------------------     -----------------------------        PCT >= 0.50 ng/mL                  PCT > 0.50 ng/mL               AND         increase in PCT                  Strongly encourage                                      initiation of antibiotics    Strongly encourage escalation           of antibiotics                                     -----------------------------                                           PCT <= 0.25 ng/mL                                                 OR                                        > 80% decrease in PCT  Discontinue / Do not initiate                                             antibiotics  Performed at [facility], [address], [place], [place] [address]   Lactate dehydrogenase     Status: Abnormal   Collection Time: 04/14/20  6:50 PM  Result Value Ref Range   LDH 199 (H) 98 - 192 U/L    Comment: Performed at [facility], [address] [place], [place] [address]  Ferritin     Status: Abnormal   Collection Time: 04/14/20   6:50 PM  Result Value Ref Range   Ferritin 643 (H) 11 - 307 ng/mL    Comment: Performed at [facility], [address] [place], [place] [address]  Triglycerides     Status: Abnormal   Collection Time: 04/14/20  6:50 PM  Result Value Ref Range   Triglycerides 160 (H) <150 mg/dL    Comment: Performed at [facility], [address], [place], [place] [address]  Fibrinogen     Status: Abnormal   Collection Time: 04/14/20  6:50 PM  Result Value Ref Range   Fibrinogen 691 (H) 210 - 475 mg/dL    Comment: Performed at [facility], [address] [place], [place] [address]  C-reactive protein     Status: Abnormal   Collection Time: 04/14/20  6:50 PM  Result Value Ref Range   CRP 6.9 (H) <1.0 mg/dL    Comment: Performed at [facility], [address] [place], [place] [address]  Troponin I (High Sensitivity)     Status: None   Collection Time: 04/14/20  7:36 PM  Result Value Ref Range   Troponin I (High Sensitivity) 17 <18 ng/L    Comment: (NOTE) Elevated high sensitivity troponin I (hsTnI) values and significant  changes across serial measurements may suggest ACS but many other  chronic and acute conditions are known to elevate hsTnI results.  Refer to the "Links" section for chest pain algorithms and additional  guidance. Performed at [facility], [address] [place], [place] [address]     Chemistries  Recent Labs  Lab 04/14/20 1850  NA 141  K 3.8  CL 103  CO2 22  GLUCOSE 98  BUN 16  CREATININE 0.78  CALCIUM 9.6  AST 38  ALT 40  ALKPHOS 60  BILITOT 2.0*   ------------------------------------------------------------------------------------------------------------------  ------------------------------------------------------------------------------------------------------------------ GFR: Estimated Creatinine Clearance: 60.7 mL/min (by C-G formula based on SCr of 0.78 mg/dL). Liver Function Tests: Recent Labs  Lab 04/14/20 1850  AST 38  ALT 40  ALKPHOS 60    BILITOT 2.0*  PROT 7.6  ALBUMIN 3.8   No results for input(s): LIPASE, AMYLASE in the last 168 hours. No results for input(s): AMMONIA in the last 168 hours. Coagulation Profile: No results for input(s): INR, PROTIME in the last 168 hours. Cardiac Enzymes: No results for input(s): CKTOTAL, CKMB, CKMBINDEX, TROPONINI in the last 168 hours. BNP (last 3 results) No results for input(s): PROBNP in the last 8760 hours. HbA1C: No results for input(s): HGBA1C in the last 72 hours. CBG: No results for input(s): GLUCAP in the last 168 hours. Lipid Profile: Recent Labs    04/14/20 1850  TRIG 160*   Thyroid Function Tests: No results for input(s): TSH, T4TOTAL, FREET4, T3FREE, THYROIDAB in the last 72 hours. Anemia Panel: Recent Labs  04/14/20 1850  FERRITIN 643*    --------------------------------------------------------------------------------------------------------------- Urine analysis:    Component Value Date/Time   COLORURINE STRAW (A) 04/14/2020 1828   APPEARANCEUR CLEAR 04/14/2020 1828   LABSPEC 1.008 04/14/2020 1828   PHURINE 6.0 04/14/2020 1828   GLUCOSEU NEGATIVE 04/14/2020 1828   HGBUR SMALL (A) 04/14/2020 1828   BILIRUBINUR NEGATIVE 04/14/2020 1828   KETONESUR 20 (A) 04/14/2020 1828   PROTEINUR 30 (A) 04/14/2020 1828   UROBILINOGEN 0.2 04/14/2008 1005   NITRITE NEGATIVE 04/14/2020 1828   LEUKOCYTESUR TRACE (A) 04/14/2020 1828      Imaging Results:    DG Chest Port 1 View  Result Date: 04/14/2020 CLINICAL DATA:  COVID-19 positive. EXAM: PORTABLE CHEST 1 VIEW COMPARISON:  October 15, 2017 FINDINGS: Cardiomediastinal silhouette is normal. Mediastinal contours appear intact. Tortuosity and calcific atherosclerotic disease of the aorta. Mild patchy bilateral airspace consolidation with peripheral predominance. Osseous structures are without acute abnormality. Soft tissues are grossly normal. IMPRESSION: Patchy bilateral airspace consolidation with peripheral  predominance, most consistent with early or mild atypical/viral pneumonia. Electronically Signed   By: Ted Mcalpineobrinka  Dimitrova M.D.   On: 04/14/2020 19:00    My personal review of EKG: Rhythm NSR, Rate 97/min, QTc 448,no Acute ST changes   Assessment & Plan:    Active Problems:   Generalized weakness   1. Hypertensive crisis 1. With history of hypertension on multiple high blood pressure medications 2. Has not been able to take medications due to generalized weakness and decreased p.o. intake 3. Blood pressure as high as 202/98 4. Without intervention improved to 170s but still over 116 5. Restart home medications 6. As needed hydralazine 7. Troponin and creatinine are at this time unaffected 8. Continue to monitor 2. Generalized weakness 1. Secondary to Covid 19 infection and decreased p.o. intake 2. 1 L bolus in ED 3. Consult PT 4. Restart diet 5. Continue to monitor 3. COVID-19 infection 1. Patient reports generalized weakness, and fever, but no respiratory symptoms 2. Patient is on day 13 of symptoms, so not meeting criteria for monoclonal antibody infusion 3. At this time patient is not hypoxic not meeting criteria for remdesivir Decadron 4. Inflammatory markers show LDH 199, triglycerides 160, ferritin 643, CRP 6.9, lactic acid 1.3, fibrinogen 691, D-dimer 1.06, procalcitonin is less than 0.10 5. Chest x-ray shows bilateral airspace consolidations most consistent with early or mild atypical pneumonia 6. Continue to monitor    DVT Prophylaxis-   Heparin - SCDs   AM Labs Ordered, also please review Full Orders  Family Communication: No family at bedside  Code Status:  Full  Admission status: Observation  Time spent in minutes : 64   Tamey Wanek B Zierle-Ghosh DO

## 2020-04-15 NOTE — Progress Notes (Signed)
Pt's daughter, Dois Davenport, called requesting updates on pt status.  Verified pt with 2 identifiers.  Pt verbally stated that I could speak with daughter and give updates. Answered questions to the best of my ability and informed Dr. Mariea Clonts that daughter stated pt would not want to be placed on a ventilator if pt deteriorated. I informed her that I let the MD know of her call and sent her name and phone number to him incase he needs to discuss with her.  She thanked me for speaking with her.

## 2020-04-15 NOTE — Evaluation (Signed)
Physical Therapy Evaluation Patient Details Name: [name] MRN: [id number] DOB: 11-Apr-1941 Today's Date: 04/15/2020   History of Present Illness  [name]  is a 79 y.o. female, with history of hypertension, history of nephrolithiasis, presents to the ED with a chief complaint of generalized weakness.  Patient reports that she is on her 13th day of symptoms from COVID-19.  She reports that she is had a fever as high as 102 today.  She reports that she is taking Tylenol for this fever and that is why she does not have a fever at presentation to the ED.  She reports that she has been weak ever since she was diagnosed with Covid.  She has a cough that has no sputum production.  She reports that she has no body aches.  She does have a decreased appetite, loss of sense of smell, and loss of sense of taste.  Patient reports her last normal meal was somewhere between a week and 2 weeks ago.  She reports that she has not been taking her medications because she is not feeling well enough to take them, and is also not eating so does not want to take on empty stomach.  Patient reports no travel.  She reports that she went to a funeral and that is where she had a sick contact with Covid.  Patient reports that she is not vaccinated because she is 79 years old and did not think it was necessary however she would like to be full code.  Patient reports that she has had intractable nausea.  Now she is too weak to even walk around.  Patient came into the ER today because she feels like she is unable to take care of herself and she is this week.    Clinical Impression  Patient presents supine in bed. Patient is awake, alert and cooperative. Patient reports she is feeling weak and does not think that she will be able to do much. Patient was able to perform bed mobility using hand rails, and verbal cues for LE placement. Patient with fair sitting balance at edge of bed. Patient able to perform sit to stand from bed  using hands, but requires several attempts, verbal cues and CG assist for stability. Patient required UE support using RW for balance in standing but was able to ambulate 10-12 feet toward door with CG assist using RW. Patient noted increased fatigue and returned to bed. Patient left upright in bed with phone and call bell within reach, nursing notified of current mobility status. Recommend SNF at this time due to general weakness, fatigue and decreased balance in standing. Patient will benefit from continued physical therapy in hospital and recommended venue below to increase strength, balance, endurance for safe ADLs and gait.      Follow Up Recommendations SNF;Supervision/Assistance - 24 hour;Supervision for mobility/OOB    Equipment Recommendations  Rolling walker with 5" wheels    Recommendations for Other Services       Precautions / Restrictions Precautions Precautions: Fall Restrictions Weight Bearing Restrictions: No      Mobility  Bed Mobility Overal bed mobility: Modified Independent             General bed mobility comments: Slow, labored movement, requires hand rails and verbal cueing  Transfers Overall transfer level: Needs assistance Equipment used: Rolling walker (2 wheeled) Transfers: Sit to/from Stand Sit to Stand: Supervision         General transfer comment: CG with transfers, fatigues quickly, several  attempts to perform sit to stand from bed  Ambulation/Gait Ambulation/Gait assistance: Supervision Gait Distance (Feet): 12 Feet Assistive device: Rolling walker (2 wheeled) Gait Pattern/deviations: Trunk flexed;Decreased step length - right;Decreased step length - left     General Gait Details: small step gait, slow cadence, CG assist for steadiness, flexed trunk  Stairs            Wheelchair Mobility    Modified Rankin (Stroke Patients Only)       Balance Overall balance assessment: Needs assistance Sitting-balance support: No  upper extremity supported;Feet supported Sitting balance-Leahy Scale: Fair Sitting balance - Comments: seated at bedside   Standing balance support: Bilateral upper extremity supported Standing balance-Leahy Scale: Poor Standing balance comment: require RW for support in standing                             Pertinent Vitals/Pain Pain Assessment: No/denies pain    Home Living Family/patient expects to be discharged to:: Private residence Living Arrangements: Spouse/significant other Available Help at Discharge: Family Type of Home: House Home Access: Stairs to enter Entrance Stairs-Rails: Can reach both Entrance Stairs-Number of Steps: 6 Home Layout: One level Home Equipment: Cane - single point      Prior Function Level of Independence: Independent with assistive device(s)         Comments: Uses cane PRN     Hand Dominance        Extremity/Trunk Assessment   Upper Extremity Assessment Upper Extremity Assessment: Overall WFL for tasks assessed    Lower Extremity Assessment Lower Extremity Assessment: Generalized weakness       Communication   Communication: No difficulties  Cognition Arousal/Alertness: Awake/alert Behavior During Therapy: WFL for tasks assessed/performed Overall Cognitive Status: Within Functional Limits for tasks assessed                                        General Comments      Exercises     Assessment/Plan    PT Assessment Patient needs continued PT services  PT Problem List Decreased strength;Decreased activity tolerance;Decreased balance;Cardiopulmonary status limiting activity;Decreased mobility       PT Treatment Interventions DME instruction;Therapeutic exercise;Gait training;Stair training;Functional mobility training;Therapeutic activities;Patient/family education;Balance training    PT Goals (Current goals can be found in the Care Plan section)  Acute Rehab PT Goals Patient Stated Goal:  Return home PT Goal Formulation: With patient Time For Goal Achievement: 04/29/20 Potential to Achieve Goals: Good    Frequency Min 3X/week   Barriers to discharge        Co-evaluation               AM-PAC PT "6 Clicks" Mobility  Outcome Measure Help needed turning from your back to your side while in a flat bed without using bedrails?: A Little Help needed moving from lying on your back to sitting on the side of a flat bed without using bedrails?: A Little Help needed moving to and from a bed to a chair (including a wheelchair)?: A Little Help needed standing up from a chair using your arms (e.g., wheelchair or bedside chair)?: A Little Help needed to walk in hospital room?: A Little Help needed climbing 3-5 steps with a railing? : A Lot 6 Click Score: 17    End of Session Equipment Utilized During Treatment: Gait belt Activity Tolerance:  Patient limited by fatigue Patient left: in bed;with call bell/phone within reach;with bed alarm set Nurse Communication: Mobility status PT Visit Diagnosis: Other abnormalities of gait and mobility (R26.89);Muscle weakness (generalized) (M62.81);Difficulty in walking, not elsewhere classified (R26.2)    Time: 1440-1510 PT Time Calculation (min) (ACUTE ONLY): 30 min   Charges:   PT Evaluation $PT Eval Low Complexity: 1 Low PT Treatments $Therapeutic Activity: 8-22 mins       3:42 PM, 04/15/20 Georges Lynch PT DPT  Physical Therapist with [facility]  [phone]

## 2020-04-16 LAB — COMPREHENSIVE METABOLIC PANEL
ALT: 27 U/L (ref 0–44)
AST: 22 U/L (ref 15–41)
Albumin: 3.1 g/dL — ABNORMAL LOW (ref 3.5–5.0)
Alkaline Phosphatase: 47 U/L (ref 38–126)
Anion gap: 13 (ref 5–15)
BUN: 21 mg/dL (ref 8–23)
CO2: 25 mmol/L (ref 22–32)
Calcium: 9.6 mg/dL (ref 8.9–10.3)
Chloride: 102 mmol/L (ref 98–111)
Creatinine, Ser: 1.04 mg/dL — ABNORMAL HIGH (ref 0.44–1.00)
GFR calc Af Amer: 59 mL/min — ABNORMAL LOW (ref >60)
GFR calc non Af Amer: 51 mL/min — ABNORMAL LOW (ref >60)
Glucose, Bld: 162 mg/dL — ABNORMAL HIGH (ref 70–99)
Potassium: 4.1 mmol/L (ref 3.5–5.1)
Sodium: 140 mmol/L (ref 135–145)
Total Bilirubin: 1.3 mg/dL — ABNORMAL HIGH (ref 0.3–1.2)
Total Protein: 6.5 g/dL (ref 6.5–8.1)

## 2020-04-16 LAB — CBC WITH DIFFERENTIAL/PLATELET
Abs Immature Granulocytes: 0.03 10*3/uL (ref 0.00–0.07)
Basophils Absolute: 0 10*3/uL (ref 0.0–0.1)
Basophils Relative: 0 %
Eosinophils Absolute: 0 10*3/uL (ref 0.0–0.5)
Eosinophils Relative: 0 %
HCT: 39.7 % (ref 36.0–46.0)
Hemoglobin: 12.5 g/dL (ref 12.0–15.0)
Immature Granulocytes: 1 %
Lymphocytes Relative: 11 %
Lymphs Abs: 0.5 10*3/uL — ABNORMAL LOW (ref 0.7–4.0)
MCH: 28.5 pg (ref 26.0–34.0)
MCHC: 31.5 g/dL (ref 30.0–36.0)
MCV: 90.4 fL (ref 80.0–100.0)
Monocytes Absolute: 0.1 10*3/uL (ref 0.1–1.0)
Monocytes Relative: 3 %
Neutro Abs: 3.9 10*3/uL (ref 1.7–7.7)
Neutrophils Relative %: 85 %
Platelets: 285 10*3/uL (ref 150–400)
RBC: 4.39 MIL/uL (ref 3.87–5.11)
RDW: 12.8 % (ref 11.5–15.5)
WBC: 4.6 10*3/uL (ref 4.0–10.5)
nRBC: 0 % (ref 0.0–0.2)

## 2020-04-16 LAB — D-DIMER, QUANTITATIVE: D-Dimer, Quant: 1.45 ug/mL-FEU — ABNORMAL HIGH (ref 0.00–0.50)

## 2020-04-16 LAB — FERRITIN: Ferritin: 489 ng/mL — ABNORMAL HIGH (ref 11–307)

## 2020-04-16 LAB — MAGNESIUM: Magnesium: 1.5 mg/dL — ABNORMAL LOW (ref 1.7–2.4)

## 2020-04-16 LAB — C-REACTIVE PROTEIN: CRP: 2 mg/dL — ABNORMAL HIGH (ref <1.0)

## 2020-04-16 LAB — GLUCOSE, CAPILLARY
Glucose-Capillary: 146 mg/dL — ABNORMAL HIGH (ref 70–99)
Glucose-Capillary: 220 mg/dL — ABNORMAL HIGH (ref 70–99)

## 2020-04-16 MED ORDER — ALBUTEROL SULFATE HFA 108 (90 BASE) MCG/ACT IN AERS: 2.0000 | INHALATION_SPRAY | Freq: Four times a day (QID) | RESPIRATORY_TRACT | 0 refills | Status: DC | PRN

## 2020-04-16 MED ORDER — ASCORBIC ACID 500 MG PO TABS
500.0000 mg | ORAL_TABLET | Freq: Every day | ORAL | 1 refills | Status: DC
Start: 2020-04-17 — End: 2022-12-30

## 2020-04-16 MED ORDER — MAGNESIUM SULFATE 4 GM/100ML IV SOLN
4.0000 g | Freq: Once | INTRAVENOUS | Status: AC
  Administered 2020-04-16: 4 g via INTRAVENOUS
  Filled 2020-04-16: qty 100

## 2020-04-16 MED ORDER — MIRTAZAPINE 15 MG PO TABS
7.5000 mg | ORAL_TABLET | Freq: Every day | ORAL | 1 refills | Status: DC
Start: 2020-04-16 — End: 2022-12-30

## 2020-04-16 MED ORDER — PREDNISONE 20 MG PO TABS: 40.0000 mg | ORAL_TABLET | Freq: Every day | ORAL | 0 refills | Status: DC

## 2020-04-16 MED ORDER — GUAIFENESIN-DM 100-10 MG/5ML PO SYRP: 10.0000 mL | ORAL_SOLUTION | ORAL | 0 refills | Status: DC | PRN

## 2020-04-16 MED ORDER — ENSURE ENLIVE PO LIQD: 237.0000 mL | Freq: Two times a day (BID) | ORAL | 1 refills | Status: AC

## 2020-04-16 MED ORDER — LOSARTAN POTASSIUM 100 MG PO TABS: 100.0000 mg | ORAL_TABLET | Freq: Every day | ORAL | 3 refills | Status: AC

## 2020-04-16 MED ORDER — ZINC SULFATE 220 (50 ZN) MG PO CAPS
220.0000 mg | ORAL_CAPSULE | Freq: Every day | ORAL | 1 refills | Status: DC
Start: 2020-04-17 — End: 2023-01-28

## 2020-04-16 NOTE — Discharge Summary (Signed)
[name], is a 79 y.o. female  DOB 04/10/1941  MRN [id number].  Admission date:  04/14/2020  Admitting Physician  Shon Haleourage Caelan Atchley, MD  Discharge Date:  04/16/2020   Primary MD  Assunta FoundGolding, John, MD  Recommendations for primary care physician for things to follow:   1) You are strongly advised to  to isolate/quarantine for at least 21 days from the date of your diagnoses with COVID-19 infection--please always wear a mask if you have to go outside the house  2) you have generalized weakness and deconditioning--- due to COVID-19 infection--- you have refused skilled nursing facility placement for rehab, you are refused home health physical therapy to help you get stronger, you refused a rolling walker on all the home medical equipment  3) video/virtual visit with the primary care physician within a week advised for recheck  Admission Diagnosis  Generalized weakness [R53.1] COVID-19 [U07.1] Acute respiratory disease due to COVID-19 virus [U07.1, J06.9]   Discharge Diagnosis  Generalized weakness [R53.1] COVID-19 [U07.1] Acute respiratory disease due to COVID-19 virus [U07.1, J06.9]    Principal Problem:   Pneumonia due to COVID-19 virus Active Problems:   Acute respiratory disease due to COVID-19 virus   Generalized weakness   Anorexia--- Covid related      Past Medical History:  Diagnosis Date  . History of kidney stones   . Hypertension     Past Surgical History:  Procedure Laterality Date  . CATARACT EXTRACTION W/PHACO Left 05/02/2019   Procedure: CATARACT EXTRACTION PHACO AND INTRAOCULAR LENS PLACEMENT (IOC);  Surgeon: Fabio PierceWrzosek, James, MD;  Location: [facility];  Service: Ophthalmology;  Laterality: Left;  CDE: 27.19  . CATARACT EXTRACTION W/PHACO Right 05/16/2019   Procedure: CATARACT EXTRACTION PHACO AND INTRAOCULAR LENS PLACEMENT RIGHT EYE;  Surgeon: Fabio PierceWrzosek, James, MD;  Location: [facility];   Service: Ophthalmology;  Laterality: Right;  CDE: 18.89  . CESAREAN SECTION       HPI  from the history and physical done on the day of admission:    [name]  is a 79 y.o. female, with history of hypertension, history of nephrolithiasis, presents to the ED with a chief complaint of generalized weakness.  Patient reports that she is on her 13th day of symptoms from COVID-19.  She reports that she is had a fever as high as 102 today.  She reports that she is taking Tylenol for this fever and that is why she does not have a fever at presentation to the ED.  She reports that she has been weak ever since she was diagnosed with Covid.  She has a cough that has no sputum production.  She reports that she has no body aches.  She does have a decreased appetite, loss of sense of smell, and loss of sense of taste.  Patient reports her last normal meal was somewhere between a week and 2 weeks ago.  She reports that she has not been taking her medications because she is not feeling well enough to take them, and is also  not eating so does not want to take on empty stomach.  Patient reports no travel.  She reports that she went to a funeral and that is where she had a sick contact with Covid.  Patient reports that she is not vaccinated because she is 79 years old and did not think it was necessary however she would like to be full code.  Patient reports that she has had intractable nausea.  Now she is too weak to even walk around.  Patient came into the ER today because she feels like she is unable to take care of herself and she is this week.  In the ED Temperature 98.5, heart rate 94, respiratory 18, blood pressure at this time is 171/116, saturating at 98% on room air Blood pressure was as high as 202/98 White blood cell count 4.8 T bili elevated at 2.0 Other inflammatory markers are listed below in the plan Chest x-ray shows bilateral airspace consolidations most consistent with early or mild atypical  pneumonia 1 L bolus was given in the ED     Hospital Course:     Brief Summary:- 79 y.o.female,with history of hypertension, history of nephrolithiasis--admitted on 04/15/20 with concerns about dehydration in the setting of anorexia and persistent nausea secondary to COVID-19 infection -Patient is not vaccinated against COVID-19 infection COVID-19 infection symptoms started on 04/02/2020 several days after attending the funeral   A/p 1)-- COVID-19 infection/Pneumonia--- cxr consistent with Covid PNA ---The treatment plan and use of medications for treatment of COVID-19 infectionand possible side effects were discussed with patient/family -----Patient/Family verbalizes understanding and agrees to treatment protocols  --Patient is positive for COVID-19 infection, chest x-ray with findings of infiltrates/opacities,   -- Encourage prone positioning for More than 16 hours/day in increments of 2 to 3 hours at a time if able to tolerate --Attempt to maintain euvolemic state --Zinc and vitamin C as ordered -Albuterol inhaler as needed - Treated with Remdesevir and Solu-medrol started on 04/15/20  2)HTN--stage II, may resume BP meds  3) anorexia---- secondary to COVID-19 infection, improving slowly, supplements advised  4)Generalized Weakness and deconditioning-PT recommends SNF rehab family declined SNF placement, requests discharge home  Disposition/--- discharge home with familyStatus is: Inpatient  Disposition: The patient is from: Home  Anticipated d/c is to: Home    Code Status : full   Family Communication:   Discussed with Spouse/ and Grand-daughter - Ms Arelly Whittenberg   Discharge Condition: Stable  Follow UP--virtual visit with PCP  Diet and Activity recommendation:  As advised  Discharge Instructions    Discharge Instructions    Call MD for:  difficulty breathing, headache or visual disturbances   Complete by: As directed    Call MD for:   persistant dizziness or light-headedness   Complete by: As directed    Call MD for:  persistant nausea and vomiting   Complete by: As directed    Call MD for:  temperature >100.4   Complete by: As directed    Diet - low sodium heart healthy   Complete by: As directed    Discharge instructions   Complete by: As directed    1) You are strongly advised to  to isolate/quarantine for at least 21 days from the date of your diagnoses with COVID-19 infection--please always wear a mask if you have to go outside the house  2) you have generalized weakness and deconditioning--- due to COVID-19 infection--- you have refused skilled nursing facility placement for rehab, you are refused home health  physical therapy to help you get stronger, you refused a rolling walker on all the home medical equipment  3) video/virtual visit with the primary care physician within a week advised for recheck   Increase activity slowly   Complete by: As directed         Discharge Medications     Allergies as of 04/16/2020   No Known Allergies     Medication List    TAKE these medications   acetaminophen 500 MG tablet Commonly known as: TYLENOL Take 1,000 mg by mouth daily as needed for fever.   albuterol 108 (90 Base) MCG/ACT inhaler Commonly known as: VENTOLIN HFA Inhale 2 puffs into the lungs every 6 (six) hours as needed for wheezing or shortness of breath.   ALPRAZolam 0.25 MG tablet Commonly known as: XANAX Take 0.25 mg by mouth 3 (three) times daily as needed for anxiety or sleep.   ascorbic acid 500 MG tablet Commonly known as: VITAMIN C Take 1 tablet (500 mg total) by mouth daily. Start taking on: April 17, 2020   atorvastatin 10 MG tablet Commonly known as: LIPITOR Take 10 mg by mouth daily.   carvedilol 3.125 MG tablet Commonly known as: COREG Take 3.125 mg by mouth 2 (two) times daily with a meal.   feeding supplement (ENSURE ENLIVE) Liqd Take 237 mLs by mouth 2 (two) times  daily between meals.   guaiFENesin-dextromethorphan 100-10 MG/5ML syrup Commonly known as: ROBITUSSIN DM Take 10 mLs by mouth every 4 (four) hours as needed for cough.   hydrALAZINE 25 MG tablet Commonly known as: APRESOLINE Take 25 mg by mouth 3 (three) times daily.   losartan 100 MG tablet Commonly known as: COZAAR Take 1 tablet (100 mg total) by mouth daily. What changed: medication strength   mirtazapine 15 MG tablet Commonly known as: REMERON Take 0.5 tablets (7.5 mg total) by mouth at bedtime.   oxybutynin 5 MG tablet Commonly known as: DITROPAN Take 5-10 mg by mouth daily.   predniSONE 20 MG tablet Commonly known as: Deltasone Take 2 tablets (40 mg total) by mouth daily with breakfast.   zinc sulfate 220 (50 Zn) MG capsule Take 1 capsule (220 mg total) by mouth daily. Start taking on: April 17, 2020       Major procedures and Radiology Reports - PLEASE review detailed and final reports for all details, in brief -   DG Chest Port 1 View  Result Date: 04/14/2020 CLINICAL DATA:  COVID-19 positive. EXAM: PORTABLE CHEST 1 VIEW COMPARISON:  October 15, 2017 FINDINGS: Cardiomediastinal silhouette is normal. Mediastinal contours appear intact. Tortuosity and calcific atherosclerotic disease of the aorta. Mild patchy bilateral airspace consolidation with peripheral predominance. Osseous structures are without acute abnormality. Soft tissues are grossly normal. IMPRESSION: Patchy bilateral airspace consolidation with peripheral predominance, most consistent with early or mild atypical/viral pneumonia. Electronically Signed   By: Ted Mcalpine M.D.   On: 04/14/2020 19:00    Micro Results    Recent Results (from the past 240 hour(s))  SARS Coronavirus 2 by RT PCR (hospital order, performed in [facility] hospital lab) Nasopharyngeal Nasopharyngeal Swab     Status: Abnormal   Collection Time: 04/14/20  6:29 PM   Specimen: Nasopharyngeal Swab  Result Value Ref Range  Status   SARS Coronavirus 2 POSITIVE (A) NEGATIVE Final    Comment: RESULT CALLED TO, READ BACK BY AND VERIFIED WITH: [place],C @ 2017 ON 04/14/20 BY JUW (NOTE) SARS-CoV-2 target nucleic acids are DETECTED  SARS-CoV-2 RNA  is generally detectable in upper respiratory specimens  during the acute phase of infection.  Positive results are indicative  of the presence of the identified virus, but do not rule out bacterial infection or co-infection with other pathogens not detected by the test.  Clinical correlation with patient history and  other diagnostic information is necessary to determine patient infection status.  The expected result is negative.  Fact Sheet for Patients:   BoilerBrush.com.cy   Fact Sheet for Healthcare Providers:   https://pope.com/    This test is not yet approved or cleared by the [place] FDA and  has been authorized for detection and/or diagnosis of SARS-CoV-2 by FDA under an Emergency Use Authorization (EUA).  This EUA will remain in effect (meaning this  test can be used) for the duration of  the COVID-19 declaration under Section 564(b)(1) of the Act, 21 U.S.C. section 360-bbb-3(b)(1), unless the authorization is terminated or revoked sooner.  Performed at [facility], [address] [place], [place] [address]   Blood Culture (routine x 2)     Status: None (Preliminary result)   Collection Time: 04/14/20  7:35 PM   Specimen: Right Antecubital; Blood  Result Value Ref Range Status   Specimen Description RIGHT ANTECUBITAL  Final   Special Requests   Final    BOTTLES DRAWN AEROBIC AND ANAEROBIC Blood Culture adequate volume   Culture   Final    NO GROWTH 2 DAYS Performed at [facility], [address], [place], [place] [address]    Report Status PENDING  Incomplete  Blood Culture (routine x 2)     Status: None (Preliminary result)   Collection Time: 04/14/20  7:40 PM   Specimen: BLOOD RIGHT HAND   Result Value Ref Range Status   Specimen Description BLOOD RIGHT HAND  Final   Special Requests   Final    BOTTLES DRAWN AEROBIC AND ANAEROBIC Blood Culture adequate volume   Culture   Final    NO GROWTH 2 DAYS Performed at [facility], [address], [place], [place] [address]    Report Status PENDING  Incomplete   Today   Subjective    Alawna Graybeal today has no new complaints -Oral intake is fair No fever  Or chills  No Nausea, Vomiting or Diarrhea    Patient has been seen and examined prior to discharge   Objective   Blood pressure (!) 163/97, pulse 88, temperature 98.7 F (37.1 C), temperature source Oral, resp. rate 16, height 5\' 10"  (1.778 m), weight 65.8 kg, SpO2 96 %.   Intake/Output Summary (Last 24 hours) at 04/16/2020 1320 Last data filed at 04/16/2020 1010 Gross per 24 hour  Intake 1686.66 ml  Output --  Net 1686.66 ml    Exam Gen:- Awake Alert, no acute distress  HEENT:- [place].AT, No sclera icterus Neck-Supple Neck,No JVD,.  Lungs-improving air movement, no wheezing CV- S1, S2 normal, regular Abd-  +ve B.Sounds, Abd Soft, No tenderness,    Extremity/Skin:- No  edema,   good pulses Psych-affect is appropriate, oriented x3 Neuro-generalized weakness, no new focal deficits, no tremors    Data Review   CBC w Diff:  Lab Results  Component Value Date   WBC 4.6 04/16/2020   HGB 12.5 04/16/2020   HCT 39.7 04/16/2020   PLT 285 04/16/2020   LYMPHOPCT 11 04/16/2020   MONOPCT 3 04/16/2020   EOSPCT 0 04/16/2020   BASOPCT 0 04/16/2020   CMP:  Lab Results  Component Value Date  NA 140 04/16/2020   K 4.1 04/16/2020   CL 102 04/16/2020   CO2 25 04/16/2020   BUN 21 04/16/2020   CREATININE 1.04 (H) 04/16/2020   PROT 6.5 04/16/2020   ALBUMIN 3.1 (L) 04/16/2020   BILITOT 1.3 (H) 04/16/2020   ALKPHOS 47 04/16/2020   AST 22 04/16/2020   ALT 27 04/16/2020   Total Discharge time is about 33 minutes  Shon Hale M.D on 04/16/2020 at 1:20 PM  Go  to [email or web address] -  for contact info  [facility] - Office  [phone]

## 2020-04-16 NOTE — TOC Initial Note (Signed)
Transition of Care [facility]) - Initial/Assessment Note    Patient Details  Name: [name] MRN: [id number] Date of Birth: 18-Jul-1941  Transition of Care [facility]) CM/SW Contact:    Karn Cassis, LCSW Phone Number: 04/16/2020, 9:07 AM  Clinical Narrative:  Pt admitted due to hypertensive crisis and weakness secondary to COVID-19. Pt reports she lives at home with her husband and they are generally pretty independent. Her husband has recovered from COVID. PT evaluated pt over weekend and recommend SNF. LCSW discussed with pt. She states she was aware of recommendation, but plans to return home. Pt said she is ambulating better now and feels she will manage fine at home with husband. LCSW suggested home health services, but pt refuses this as well. She does not think she will need any additional equipment at home as she has a cane and a walker. TOC will continue to follow.                  Expected Discharge Plan: Home/Self Care Barriers to Discharge: Continued Medical Work up   Patient Goals and CMS Choice Patient states their goals for this hospitalization and ongoing recovery are:: return home   Choice offered to / list presented to : Patient  Expected Discharge Plan and Services Expected Discharge Plan: Home/Self Care In-house Referral: Clinical Social Work                                 HH Arranged: Refused SNF, Refused HH          Prior Living Arrangements/Services   Lives with:: Spouse Patient language and need for interpreter reviewed:: Yes Do you feel safe going back to the place where you live?: Yes      Need for Family Participation in Patient Care: Yes (Comment) Care giver support system in place?: Yes (comment) Current home services: DME (cane, walker) Criminal Activity/Legal Involvement Pertinent to Current Situation/Hospitalization: No - Comment as needed  Activities of Daily Living Home Assistive Devices/Equipment: None ADL Screening  (condition at time of admission) Patient's cognitive ability adequate to safely complete daily activities?: Yes Is the patient deaf or have difficulty hearing?: No Does the patient have difficulty seeing, even when wearing glasses/contacts?: No Does the patient have difficulty concentrating, remembering, or making decisions?: Yes Patient able to express need for assistance with ADLs?: Yes Does the patient have difficulty dressing or bathing?: No Independently performs ADLs?: No Communication: Independent Dressing (OT): Needs assistance Is this a change from baseline?: Pre-admission baseline Grooming: Independent Feeding: Independent Bathing: Needs assistance Is this a change from baseline?: Pre-admission baseline Toileting: Needs assistance Is this a change from baseline?: Pre-admission baseline In/Out Bed: Needs assistance Is this a change from baseline?: Pre-admission baseline Walks in Home: Needs assistance Is this a change from baseline?: Pre-admission baseline Does the patient have difficulty walking or climbing stairs?: Yes Weakness of Legs: Both Weakness of Arms/Hands: None  Permission Sought/Granted                  Emotional Assessment     Affect (typically observed): Accepting Orientation: : Oriented to Self, Oriented to  Time, Oriented to Situation, Oriented to Place Alcohol / Substance Use: Not Applicable Psych Involvement: Outpatient Provider (PCP)  Admission diagnosis:  Generalized weakness [R53.1] COVID-19 [U07.1] Acute respiratory disease due to COVID-19 virus [U07.1, J06.9] Patient Active Problem List   Diagnosis Date Noted  . Acute respiratory disease due  to COVID-19 virus 04/15/2020  . Anorexia--- Covid related 04/15/2020  . Pneumonia due to COVID-19 virus 04/15/2020  . Generalized weakness 04/14/2020   PCP:  Assunta Found, MD Pharmacy:   [facility] [place], [place] - [address] [place] [address] Phone: [phone] Fax:  [phone]  [id number] - [place], [place] - [address] AT [facility] [address] [place] [address] Phone: [phone] Fax: [phone]     Social Determinants of Health (SDOH) Interventions    Readmission Risk Interventions No flowsheet data found.

## 2020-04-16 NOTE — Progress Notes (Signed)
Nsg Discharge Note  Admit Date:  04/14/2020 Discharge date: 04/16/2020   [name] to be D/C'd home per MD order.  AVS completed.  Copy for chart, and copy for patient signed, and dated. Patient/caregiver able to verbalize understanding.  Discharge Medication: Allergies as of 04/16/2020   No Known Allergies     Medication List    TAKE these medications   acetaminophen 500 MG tablet Commonly known as: TYLENOL Take 1,000 mg by mouth daily as needed for fever.   albuterol 108 (90 Base) MCG/ACT inhaler Commonly known as: VENTOLIN HFA Inhale 2 puffs into the lungs every 6 (six) hours as needed for wheezing or shortness of breath.   ALPRAZolam 0.25 MG tablet Commonly known as: XANAX Take 0.25 mg by mouth 3 (three) times daily as needed for anxiety or sleep.   ascorbic acid 500 MG tablet Commonly known as: VITAMIN C Take 1 tablet (500 mg total) by mouth daily. Start taking on: April 17, 2020   atorvastatin 10 MG tablet Commonly known as: LIPITOR Take 10 mg by mouth daily.   carvedilol 3.125 MG tablet Commonly known as: COREG Take 3.125 mg by mouth 2 (two) times daily with a meal.   feeding supplement (ENSURE ENLIVE) Liqd Take 237 mLs by mouth 2 (two) times daily between meals.   guaiFENesin-dextromethorphan 100-10 MG/5ML syrup Commonly known as: ROBITUSSIN DM Take 10 mLs by mouth every 4 (four) hours as needed for cough.   hydrALAZINE 25 MG tablet Commonly known as: APRESOLINE Take 25 mg by mouth 3 (three) times daily.   losartan 100 MG tablet Commonly known as: COZAAR Take 1 tablet (100 mg total) by mouth daily. What changed: medication strength   mirtazapine 15 MG tablet Commonly known as: REMERON Take 0.5 tablets (7.5 mg total) by mouth at bedtime.   oxybutynin 5 MG tablet Commonly known as: DITROPAN Take 5-10 mg by mouth daily.   predniSONE 20 MG tablet Commonly known as: Deltasone Take 2 tablets (40 mg total) by mouth daily with breakfast.    zinc sulfate 220 (50 Zn) MG capsule Take 1 capsule (220 mg total) by mouth daily. Start taking on: April 17, 2020       Discharge Assessment: Vitals:   04/15/20 2243 04/16/20 0614  BP: (!) 157/90 (!) 163/97  Pulse: 78 88  Resp: 16 16  Temp: 98.4 F (36.9 C) 98.7 F (37.1 C)  SpO2: 96% 96%   Skin clean, dry and intact without evidence of skin break down, no evidence of skin tears noted. IV catheter discontinued intact. Site without signs and symptoms of complications - no redness or edema noted at insertion site, patient denies c/o pain - only slight tenderness at site.  Dressing with slight pressure applied.  D/c Instructions-Education: Discharge instructions given to patient/family with verbalized understanding. D/c education completed with patient/family including follow up instructions, medication list, d/c activities limitations if indicated, with other d/c instructions as indicated by MD - patient able to verbalize understanding, all questions fully answered. Patient instructed to return to ED, call 911, or call MD for any changes in condition.  Patient escorted via WC, and D/C home via private auto.  Rocco Pauls, RN 04/16/2020 2:09 PM

## 2020-04-16 NOTE — Discharge Instructions (Signed)
1) You are strongly advised to  to isolate/quarantine for at least 21 days from the date of your diagnoses with COVID-19 infection--please always wear a mask if you have to go outside the house  2) you have generalized weakness and deconditioning--- due to COVID-19 infection--- you have refused skilled nursing facility placement for rehab, you are refused home health physical therapy to help you get stronger, you refused a rolling walker on all the home medical equipment  3) video/virtual visit with the primary care physician within a week advised for recheck

## 2020-04-17 ENCOUNTER — Other Ambulatory Visit: Payer: Self-pay | Admitting: *Deleted

## 2020-04-17 ENCOUNTER — Other Ambulatory Visit: Payer: Self-pay

## 2020-04-17 NOTE — Patient Outreach (Signed)
[facility]) Care Management  04/17/2020  SADEEN WIEGEL 05/03/1941 [id number]   [facility] Post hospital Referral   Referral Date:  04/17/20 Referral Source: Flo Shanks [facility] hospital liaison Date of Admission & discharge: 04/14/20-04/16/20 Diagnosis: covid 19/pneumonia  Facility[facility] [place] Insurance:  [place] [facility]) medicare   Outreach attempt #1 unsuccessful to her home/mobile number no answer and voice mail box is full  Outreach to her husband, willie number  A female answered to confirm Mrs Warman is sleeping at this time but ws doing "much better"  [facility] RN CM left HIPAA [facility] Insurance Portability and Accountability Act) compliant voicemail message along with CM's contact info.    Consent: [facility] RN CM reviewed [facility] services Advised that post discharge calls may occur to assess how the patient is doing following the recent hospitalization.   Plan: [facility] RN CM will follow up with patient with in the next 4-7 business days Pt encouraged to return a call to [facility] RN CM prn [facility] unsuccessful outreach letter sent to patient    Cala Bradford L. Noelle Penner, RN, BSN, CCM [facility] Telephonic Care Management Care Coordinator Direct Number [phone] Mobile number [phone]  Main [facility] number [phone] Fax number [phone]

## 2020-04-18 ENCOUNTER — Other Ambulatory Visit: Payer: Self-pay | Admitting: *Deleted

## 2020-04-18 NOTE — Patient Outreach (Addendum)
[facility]) Care Management  04/18/2020  [name] 1940/12/09 [id number]   [facility] second Unsuccessful outreach   Outreach attempt to the home number  No answer. [phone] (home and mobile number for pt) voice mail box is full and will not allow to allow CM to leave a voice message  [facility] RN CM was able to leave a message on 04/17/20 with a female family member at pt's husband contact number to include [facility] RN CM's office number for a return call  Automated voice message states voice mail box is full therefore [facility] RN CM is not able to leave a message  Plan: [facility] RN CM scheduled this patient for another call attempt within 4-7 business days  Samona Chihuahua L. Noelle Penner, RN, BSN, CCM [facility] Telephonic Care Management Care Coordinator Office number [phone] Mobile number [phone]  Main [facility] number [phone] Fax number [phone]

## 2020-04-19 LAB — CULTURE, BLOOD (ROUTINE X 2)
Culture: NO GROWTH
Culture: NO GROWTH
Special Requests: ADEQUATE
Special Requests: ADEQUATE

## 2020-04-25 ENCOUNTER — Other Ambulatory Visit: Payer: Self-pay | Admitting: *Deleted

## 2020-04-25 NOTE — Patient Outreach (Signed)
[facility]) Care Management  04/25/2020  JAMARI DIANA 1941/04/12 [id number]   [facility] Unsuccessful outreach (3rd attempt)   Outreach attempt to the home number  No answer.  Automated voice message states voice mail box is full therefore [facility] RN CM is not able to leave a message     Plan: [facility] RN CM scheduled this patient per [facility] workflow for another call attempt in 3 weeks prior to case closure  Routed to PCP  Janique Hoefer L. Noelle Penner, RN, BSN, CCM [facility] Telephonic Care Management Care Coordinator Office number [phone] Mobile number [phone]  Main [facility] number [phone] Fax number [phone]

## 2020-05-11 DIAGNOSIS — I10 Essential (primary) hypertension: Secondary | ICD-10-CM | POA: Diagnosis not present

## 2020-05-11 DIAGNOSIS — Z6822 Body mass index (BMI) 22.0-22.9, adult: Secondary | ICD-10-CM | POA: Diagnosis not present

## 2020-05-11 DIAGNOSIS — J1282 Pneumonia due to coronavirus disease 2019: Secondary | ICD-10-CM | POA: Diagnosis not present

## 2020-05-16 ENCOUNTER — Other Ambulatory Visit: Payer: Self-pay | Admitting: *Deleted

## 2020-05-16 NOTE — Patient Outreach (Signed)
[facility]) Care Management  05/16/2020  [name] [birth year] [id number]   [facility] Case closure   Mrs [name] was referred to [facility] on 04/17/20 by Flo Shanks [facility] hospital Liaison after hospital discharge 04/14/20-04/16/20 Diagnosis: covid 19/pneumonia  Facility[facility] ([place]) Insurance:  [facility] care [facility]) medicare    Call attempts made on 04/17/20, 04/18/20, 04/25/20 and 05/16/20 Unsuccessful outreach letter sent on 04/17/20 without a response   Plan [facility] RN CM will close case after no response from patient within 10 business days. Unable to reach Case closure letters sent to patient and MD  Cala Bradford L. Noelle Penner, RN, BSN, CCM [facility] Telephonic Care Management Care Coordinator Office number [phone] Mobile number [phone]  Main [facility] number [phone] Fax number [phone]

## 2020-12-07 DIAGNOSIS — Z0000 Encounter for general adult medical examination without abnormal findings: Secondary | ICD-10-CM | POA: Diagnosis not present

## 2020-12-07 DIAGNOSIS — R7309 Other abnormal glucose: Secondary | ICD-10-CM | POA: Diagnosis not present

## 2020-12-07 DIAGNOSIS — Z6822 Body mass index (BMI) 22.0-22.9, adult: Secondary | ICD-10-CM | POA: Diagnosis not present

## 2020-12-07 DIAGNOSIS — N183 Chronic kidney disease, stage 3 unspecified: Secondary | ICD-10-CM | POA: Diagnosis not present

## 2020-12-07 DIAGNOSIS — E7849 Other hyperlipidemia: Secondary | ICD-10-CM | POA: Diagnosis not present

## 2020-12-07 DIAGNOSIS — I10 Essential (primary) hypertension: Secondary | ICD-10-CM | POA: Diagnosis not present

## 2020-12-26 DIAGNOSIS — Z0000 Encounter for general adult medical examination without abnormal findings: Secondary | ICD-10-CM | POA: Diagnosis not present

## 2020-12-26 DIAGNOSIS — Z6822 Body mass index (BMI) 22.0-22.9, adult: Secondary | ICD-10-CM | POA: Diagnosis not present

## 2020-12-26 DIAGNOSIS — Z1389 Encounter for screening for other disorder: Secondary | ICD-10-CM | POA: Diagnosis not present

## 2020-12-26 DIAGNOSIS — E782 Mixed hyperlipidemia: Secondary | ICD-10-CM | POA: Diagnosis not present

## 2020-12-26 DIAGNOSIS — E7849 Other hyperlipidemia: Secondary | ICD-10-CM | POA: Diagnosis not present

## 2020-12-26 DIAGNOSIS — E039 Hypothyroidism, unspecified: Secondary | ICD-10-CM | POA: Diagnosis not present

## 2021-01-24 IMAGING — DX DG CHEST 1V PORT
1 series · 1 of 1 positions shown · non-contrast
Comparison: October 15, 2017

CLINICAL DATA: [id number] positive.

EXAM:
PORTABLE CHEST 1 VIEW

[chest ap]
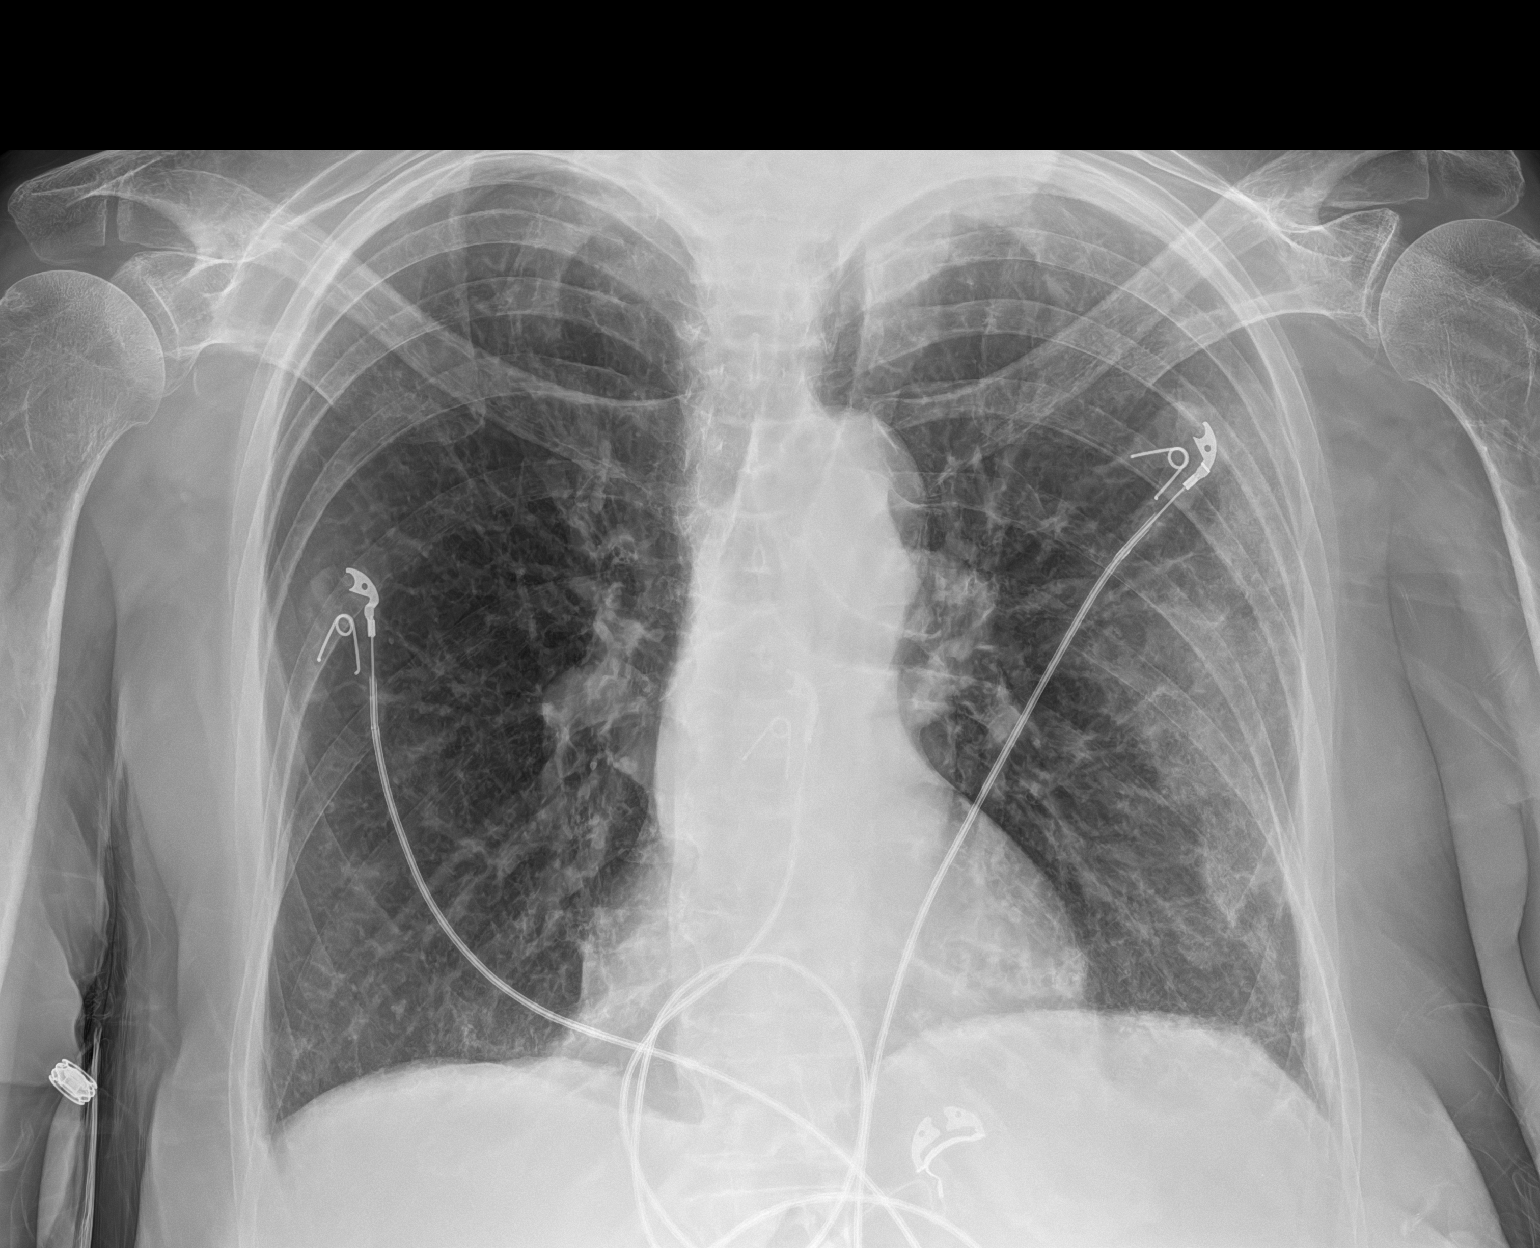

[1 of 1 positions shown; findings below may reference images not displayed]

FINDINGS: Cardiomediastinal silhouette is normal. Mediastinal contours appear
intact. Tortuosity and calcific atherosclerotic disease of the
aorta.

Mild patchy bilateral airspace consolidation with peripheral
predominance.

Osseous structures are without acute abnormality. Soft tissues are
grossly normal.
IMPRESSION: Patchy bilateral airspace consolidation with peripheral
predominance, most consistent with early or mild atypical/viral
pneumonia.

## 2021-06-19 DIAGNOSIS — Z6820 Body mass index (BMI) 20.0-20.9, adult: Secondary | ICD-10-CM | POA: Diagnosis not present

## 2021-06-19 DIAGNOSIS — I10 Essential (primary) hypertension: Secondary | ICD-10-CM | POA: Diagnosis not present

## 2021-06-20 DIAGNOSIS — I10 Essential (primary) hypertension: Secondary | ICD-10-CM | POA: Diagnosis not present

## 2022-03-17 DIAGNOSIS — Z0001 Encounter for general adult medical examination with abnormal findings: Secondary | ICD-10-CM | POA: Diagnosis not present

## 2022-03-17 DIAGNOSIS — I10 Essential (primary) hypertension: Secondary | ICD-10-CM | POA: Diagnosis not present

## 2022-03-17 DIAGNOSIS — N183 Chronic kidney disease, stage 3 unspecified: Secondary | ICD-10-CM | POA: Diagnosis not present

## 2022-03-17 DIAGNOSIS — Z681 Body mass index (BMI) 19 or less, adult: Secondary | ICD-10-CM | POA: Diagnosis not present

## 2022-03-27 ENCOUNTER — Telehealth: Payer: Self-pay | Admitting: *Deleted

## 2022-03-27 NOTE — Patient Outreach (Signed)
  Care Coordination   03/27/2022 Name: [name] MRN: [id number] DOB: 13-Mar-1941   Care Coordination Outreach Attempts:  An unsuccessful telephone outreach was attempted today to offer the patient information about available care coordination services as a benefit of their health plan.   Follow Up Plan:  Additional outreach attempts will be made to offer the patient care coordination information and services.   Encounter Outcome:  No Answer  Care Coordination Interventions Activated:  No   Care Coordination Interventions:  No, not indicated    Demetrios Loll, BSN, RN-BC RN Care Coordinator [facility] Dial: [phone]

## 2022-04-01 ENCOUNTER — Telehealth: Payer: Self-pay | Admitting: *Deleted

## 2022-04-01 NOTE — Patient Outreach (Signed)
  Care Coordination   04/01/2022 Name: AYA GEISEL MRN: [id number] DOB: [birth year]   Care Coordination Outreach Attempts:  A second unsuccessful outreach was attempted today to offer the patient with information about available care coordination services as a benefit of their health plan.     Follow Up Plan:  Additional outreach attempts will be made to offer the patient care coordination information and services.   Encounter Outcome:  No Answer  Care Coordination Interventions Activated:  No   Care Coordination Interventions:  No, not indicated    Demetrios Loll, BSN, RN-BC RN Care Coordinator [facility]  [facility] Direct Dial: [phone] Main #: [phone]

## 2022-04-28 ENCOUNTER — Telehealth: Payer: Self-pay | Admitting: *Deleted

## 2022-04-28 NOTE — Patient Outreach (Signed)
  Care Coordination   04/28/2022 Name: [name] MRN: [id number] DOB: March 18, 1941   Care Coordination Outreach Attempts:  A third unsuccessful outreach was attempted today to offer the patient with information about available care coordination services as a benefit of their health plan.   Follow Up Plan:  No further outreach attempts will be made at this time. We have been unable to contact the patient to offer or enroll patient in care coordination services  Encounter Outcome:  No Answer  Care Coordination Interventions Activated:  No   Care Coordination Interventions:  No, not indicated    Valente David, RN, MSN, [facility] Care Management Care Management Coordinator [phone]

## 2022-12-02 ENCOUNTER — Encounter (HOSPITAL_COMMUNITY): Payer: Self-pay

## 2022-12-02 ENCOUNTER — Emergency Department (HOSPITAL_COMMUNITY): Payer: Medicare Other

## 2022-12-02 ENCOUNTER — Other Ambulatory Visit: Payer: Self-pay

## 2022-12-02 ENCOUNTER — Emergency Department (HOSPITAL_COMMUNITY)
Admission: EM | Admit: 2022-12-02 | Discharge: 2022-12-02 | Disposition: A | Discharge status: Home / Self Care | Payer: Medicare Other | Attending: Emergency Medicine | Admitting: Emergency Medicine

## 2022-12-02 DIAGNOSIS — R350 Frequency of micturition: Secondary | ICD-10-CM | POA: Insufficient documentation

## 2022-12-02 DIAGNOSIS — I129 Hypertensive chronic kidney disease with stage 1 through stage 4 chronic kidney disease, or unspecified chronic kidney disease: Secondary | ICD-10-CM | POA: Insufficient documentation

## 2022-12-02 DIAGNOSIS — M79671 Pain in right foot: Secondary | ICD-10-CM | POA: Diagnosis not present

## 2022-12-02 DIAGNOSIS — Z79899 Other long term (current) drug therapy: Secondary | ICD-10-CM | POA: Insufficient documentation

## 2022-12-02 DIAGNOSIS — N183 Chronic kidney disease, stage 3 unspecified: Secondary | ICD-10-CM | POA: Insufficient documentation

## 2022-12-02 DIAGNOSIS — R202 Paresthesia of skin: Secondary | ICD-10-CM

## 2022-12-02 DIAGNOSIS — M79672 Pain in left foot: Secondary | ICD-10-CM | POA: Diagnosis not present

## 2022-12-02 LAB — URINALYSIS, ROUTINE W REFLEX MICROSCOPIC
Bilirubin Urine: NEGATIVE
Glucose, UA: NEGATIVE mg/dL
Hgb urine dipstick: NEGATIVE
Ketones, ur: 5 mg/dL — AB
Leukocytes,Ua: NEGATIVE
Nitrite: NEGATIVE
Protein, ur: NEGATIVE mg/dL
Specific Gravity, Urine: 1.008 (ref 1.005–1.030)
pH: 8 (ref 5.0–8.0)

## 2022-12-02 LAB — CBC WITH DIFFERENTIAL/PLATELET
Abs Immature Granulocytes: 0.03 10*3/uL (ref 0.00–0.07)
Basophils Absolute: 0 10*3/uL (ref 0.0–0.1)
Basophils Relative: 1 %
Eosinophils Absolute: 0 10*3/uL (ref 0.0–0.5)
Eosinophils Relative: 1 %
HCT: 42.4 % (ref 36.0–46.0)
Hemoglobin: 13.6 g/dL (ref 12.0–15.0)
Immature Granulocytes: 0 %
Lymphocytes Relative: 10 %
Lymphs Abs: 0.9 10*3/uL (ref 0.7–4.0)
MCH: 29.2 pg (ref 26.0–34.0)
MCHC: 32.1 g/dL (ref 30.0–36.0)
MCV: 91.2 fL (ref 80.0–100.0)
Monocytes Absolute: 0.4 10*3/uL (ref 0.1–1.0)
Monocytes Relative: 5 %
Neutro Abs: 7.1 10*3/uL (ref 1.7–7.7)
Neutrophils Relative %: 83 %
Platelets: 192 10*3/uL (ref 150–400)
RBC: 4.65 MIL/uL (ref 3.87–5.11)
RDW: 14 % (ref 11.5–15.5)
WBC: 8.5 10*3/uL (ref 4.0–10.5)
nRBC: 0 % (ref 0.0–0.2)

## 2022-12-02 LAB — CBG MONITORING, ED: Glucose-Capillary: 136 mg/dL — ABNORMAL HIGH (ref 70–99)

## 2022-12-02 LAB — BASIC METABOLIC PANEL
Anion gap: 10 (ref 5–15)
BUN: 14 mg/dL (ref 8–23)
CO2: 26 mmol/L (ref 22–32)
Calcium: 10.3 mg/dL (ref 8.9–10.3)
Chloride: 103 mmol/L (ref 98–111)
Creatinine, Ser: 0.85 mg/dL (ref 0.44–1.00)
GFR, Estimated: >60 mL/min (ref >60)
Glucose, Bld: 128 mg/dL — ABNORMAL HIGH (ref 70–99)
Potassium: 3.6 mmol/L (ref 3.5–5.1)
Sodium: 139 mmol/L (ref 135–145)

## 2022-12-02 LAB — VITAMIN B12: Vitamin B-12: 175 pg/mL — ABNORMAL LOW (ref 180–914)

## 2022-12-02 MED ORDER — ALPRAZOLAM 0.5 MG PO TABS
0.2500 mg | ORAL_TABLET | Freq: Once | ORAL | Status: AC
  Administered 2022-12-02: 0.25 mg via ORAL
  Filled 2022-12-02: qty 1

## 2022-12-02 MED ORDER — ALPRAZOLAM 0.25 MG PO TABS: 0.2500 mg | ORAL_TABLET | Freq: Two times a day (BID) | ORAL | 0 refills | Status: AC

## 2022-12-02 MED ORDER — ONDANSETRON HCL 4 MG/5ML PO SOLN
4.0000 mg | Freq: Once | ORAL | Status: AC
  Administered 2022-12-02: 4 mg via ORAL
  Filled 2022-12-02: qty 1

## 2022-12-02 MED ORDER — FOLIC ACID 1 MG PO TABS
1.0000 mg | ORAL_TABLET | Freq: Once | ORAL | Status: AC
  Administered 2022-12-02: 1 mg via ORAL
  Filled 2022-12-02: qty 1

## 2022-12-02 MED ORDER — CYANOCOBALAMIN 1000 MCG/ML IJ SOLN
1000.0000 ug | Freq: Once | INTRAMUSCULAR | Status: AC
  Administered 2022-12-02: 1000 ug via INTRAMUSCULAR
  Filled 2022-12-02: qty 1

## 2022-12-02 MED ORDER — OXYCODONE HCL 5 MG PO TABS
5.0000 mg | ORAL_TABLET | Freq: Once | ORAL | Status: AC
  Administered 2022-12-02: 5 mg via ORAL
  Filled 2022-12-02: qty 1

## 2022-12-02 NOTE — ED Notes (Signed)
Post void residual was 41

## 2022-12-02 NOTE — ED Notes (Signed)
Pt c/o of her feet burning, no obvious skin breakdown noted.

## 2022-12-02 NOTE — ED Triage Notes (Signed)
Pt states her feet have been burning for a couple weeks now and have gotten worse over night. Pt states she has been nauseous since this morning. Also, complaining of urinary frequency.

## 2022-12-02 NOTE — Discharge Instructions (Signed)
Thank you for coming to [facility] Emergency Department. You were seen for burning in your feet, urinary frequency. We did an exam, labs, and imaging, and these showed low B12, which was repleted here, however I do not believe that it is low enough to be causing her symptoms.  You requested a refill on your alprazolam and you can see your primary care physician.  I gave you 3 to 4 days of this medication.  Please follow-up with your primary care physician for urinary frequency, and your burning in your feet.  You may need further workup and testing.  Please follow up with your primary care provider within 1 week.   Do not hesitate to return to the ED or call 911 if you experience: -Worsening symptoms -Weakness in your lower extremities -Difficulty breathing -Abdominal pain -Lightheadedness, passing out -Fevers/chills -Anything else that concerns you

## 2022-12-02 NOTE — ED Provider Notes (Signed)
[place] EMERGENCY DEPARTMENT AT [facility] Provider Note   CSN: [id number] Arrival date & time: 12/02/22  1020     History  Chief Complaint  Patient presents with   Foot Burn   Urinary Frequency   Nausea    [name] is a 82 y.o. female with HTN, CKD stage 3, anemia of chronic disease, who presents with feet burning, urinary frequency, nausea.   Pt states her feet have been burning for a couple weeks now and have gotten worse over night.  She is never had this happen before.  Intermittently feels like burning in her bilateral feet up to her mid shins on both sides.  Denies any trauma.  No weakness or falls.  Says it feels like she wants to move them a lot.  Has been diagnosed with prediabetes in the past but has never been managed for diabetes or take any insulin.  She has no history of alcohol use or vitamin deficiencies that she is aware of.  Also, complaining of urinary frequency that has been ongoing for the last 2 days.  No associated dysuria hematuria.  No fevers chills.  No abdominal pain.  Does have some nausea with no vomiting, states has been constantly nauseated all day.    Urinary Frequency       Home Medications Prior to Admission medications   Medication Sig Start Date End Date Taking? Authorizing Provider  acetaminophen (TYLENOL) 500 MG tablet Take 1,000 mg by mouth daily as needed for fever.    [provider]  albuterol (VENTOLIN HFA) 108 (90 Base) MCG/ACT inhaler Inhale 2 puffs into the lungs every 6 (six) hours as needed for wheezing or shortness of breath. 04/16/20   Shon Hale, MD  ascorbic acid (VITAMIN C) 500 MG tablet Take 1 tablet (500 mg total) by mouth daily. 04/17/20   Shon Hale, MD  atorvastatin (LIPITOR) 10 MG tablet Take 10 mg by mouth daily.     [provider]  carvedilol (COREG) 3.125 MG tablet Take 3.125 mg by mouth 2 (two) times daily with a meal.    [provider]  feeding supplement,  ENSURE ENLIVE, (ENSURE ENLIVE) LIQD Take 237 mLs by mouth 2 (two) times daily between meals. 04/16/20   Shon Hale, MD  guaiFENesin-dextromethorphan (ROBITUSSIN DM) 100-10 MG/5ML syrup Take 10 mLs by mouth every 4 (four) hours as needed for cough. 04/16/20   Shon Hale, MD  hydrALAZINE (APRESOLINE) 25 MG tablet Take 25 mg by mouth 3 (three) times daily. 03/03/19   [provider]  losartan (COZAAR) 100 MG tablet Take 1 tablet (100 mg total) by mouth daily. 04/16/20   Shon Hale, MD  mirtazapine (REMERON) 15 MG tablet Take 0.5 tablets (7.5 mg total) by mouth at bedtime. 04/16/20   Shon Hale, MD  oxybutynin (DITROPAN) 5 MG tablet Take 5-10 mg by mouth daily. 01/28/20   [provider]  predniSONE (DELTASONE) 20 MG tablet Take 2 tablets (40 mg total) by mouth daily with breakfast. 04/16/20   Shon Hale, MD  zinc sulfate 220 (50 Zn) MG capsule Take 1 capsule (220 mg total) by mouth daily. 04/17/20   Shon Hale, MD      Allergies    Patient has no known allergies.    Review of Systems   Review of Systems  Genitourinary:  Positive for frequency.   Review of systems Negative for falls, trauma.  A 10 point review of systems was performed and is negative unless otherwise  reported in HPI.  Physical Exam Updated Vital Signs BP (!) 150/82   Pulse 81   Temp 98.7 F (37.1 C) (Oral)   Resp 19   SpO2 94%  Physical Exam General: Normal appearing female, lying in bed.  HEENT: Sclera anicteric, MMM, trachea midline.  Cardiology: RRR, no murmurs/rubs/gallops. BL radial and DP pulses equal bilaterally.  Resp: Normal respiratory rate and effort. CTAB, no wheezes, rhonchi, crackles.  Abd: Soft, non-tender, non-distended. No rebound tenderness or guarding.  GU: Deferred. MSK: No peripheral edema or signs of trauma. Extremities without deformity or TTP. No cyanosis or clubbing. Skin: warm, dry.  Neuro: A&Ox4, CNs II-XII grossly intact. 5/5 strength of  extremities. Sensation grossly intact. Normal FTN. Intermittent movements of bilateral feet that stop when prompted. Psych: Normal mood and affect.   ED Results / Procedures / Treatments   Labs (all labs ordered are listed, but only abnormal results are displayed) Labs Reviewed  URINE CULTURE - Abnormal; Notable for the following components:      Result Value   Culture   (*)    Value: <10,000 COLONIES/mL INSIGNIFICANT GROWTH Performed at [facility], [address], [place], [place] [address]    All other components within normal limits  URINALYSIS, ROUTINE W REFLEX MICROSCOPIC - Abnormal; Notable for the following components:   Color, Urine STRAW (*)    APPearance HAZY (*)    Ketones, ur 5 (*)    All other components within normal limits  BASIC METABOLIC PANEL - Abnormal; Notable for the following components:   Glucose, Bld 128 (*)    All other components within normal limits  VITAMIN B12 - Abnormal; Notable for the following components:   Vitamin B-12 175 (*)    All other components within normal limits  CBG MONITORING, ED - Abnormal; Notable for the following components:   Glucose-Capillary 136 (*)    All other components within normal limits  CBC WITH DIFFERENTIAL/PLATELET    EKG None  Radiology No results found.  Procedures Procedures    Medications Ordered in ED Medications  ondansetron (ZOFRAN) 4 MG/5ML solution 4 mg (4 mg Oral Given 12/02/22 1212)  oxyCODONE (Oxy IR/ROXICODONE) immediate release tablet 5 mg (5 mg Oral Given 12/02/22 1211)  cyanocobalamin (VITAMIN B12) injection 1,000 mcg (1,000 mcg Intramuscular Given 12/02/22 1451)  folic acid (FOLVITE) tablet 1 mg (1 mg Oral Given 12/02/22 1451)  ALPRAZolam (XANAX) tablet 0.25 mg (0.25 mg Oral Given 12/02/22 1555)    ED Course/ Medical Decision Making/ A&P                          Medical Decision Making Amount and/or Complexity of Data Reviewed Labs: ordered. Decision-making details documented in ED  Course. Radiology:  Decision-making details documented in ED Course.  Risk Prescription drug management.    This patient presents to the ED for concern of feet burning; this involves an extensive number of treatment options, and is a complaint that carries with it a high risk of complications and morbidity.  I considered the following differential and admission for this acute, potentially life threatening condition.   MDM:    Consider diabetic neuropathy the patient has no history of diabetes.  Consider urinary frequency as possible UTI associated with nausea which could explain the symptoms.  However her UA does not demonstrate any UTI.  Consider possible incomplete bladder emptying given patient the sensation of frequency and will obtain a PVR.  With her  sensation of peripheral neuropathy and urinary frequency consider diabetes however patient has no glucosuria on he blood glucose here today is 136. She doesn't have stress incontinence but rather urgency. Patient with no emergent cause of her symptoms and can f/u with PCP about this.   For her new onset bilateral lower extremity paresthesias, consider peripheral neuropathy such as diabetic neuropathy, vitamin B12 deficiency.  She has no rash or bruising, raised skin, petechia or purpura to indicate vasculitis.  She has good peripheral pulses and warm feet bilaterally, lower concern for arterial ischemia and her symptoms are equal bilaterally.  She has no swelling, no concern for compartment syndrome or bilateral DVT.  No history of alcohol use per patient report, low concern for alcoholic neuropathy.   Clinical Course as of 12/14/22 1514  Tue Dec 02, 2022  1206 Urinalysis, Routine w reflex microscopic -Urine, Clean Catch(!) No UTI, culture pending given symptoms [HN]  1410 Vitamin B12(!): 175 Deficient, however symptoms do not usually appear until ,100 pg/mL. However, will replete with a vit B12 shot and 1mg  folic acid PO.  [HN]  1411 CBC  with Differential wnl [HN]  1411 Glucose-Capillary(!): 136 [HN]  1411 Basic metabolic panel(!) unremarkable [HN]  1438 US ARTERIAL ABI (SCREENING LOWER EXTREMITY) Normal ankle-brachial index these. [HN]    Clinical Course User Index [HN] Loetta Rough, MD    Labs: I Ordered, and personally interpreted labs.  The pertinent results include:  those listed above  Imaging Studies ordered: I ordered imaging studies including [place] ABI I independently visualized and interpreted imaging. I agree with the radiologist interpretation  Additional history obtained from chart review, husband at bedside.    Reevaluation: On reevaluation of the patient, husband asks for a refill of her alprazolam.  He states that she is been out of her alprazolam for approximately 1 to 2 months.  He states that they tried to make an appointment with her primary care physician today in order to make the refill as well as for the feet burning but could not get an appointment.  He states that the patient has been acting funny and been very anxious as a result of not having her alprazolam which she has taken for many years for anxiety.  Patient's husband states that he thinks that is the reason for her symptoms today, as they seem to have coincided temporally. The patient states she does not feel particularly anxious.  Given that her intermittent feet movements stop when I asked the patient to stop, husband states patient has been acting "funny," I suspect some of her symptoms are due to her rebound anxiety in the setting of not refilling her alprazolam.  She does have a low B12 however not sufficiently low to be causing her symptoms.  I have repleted that as well as folic acid.  Will refill the patient's alprazolam and off for a few days to get her to her primary care physician appointment.  I encouraged them to discuss with her PCP daily medications for her anxiety that are not benzodiazepines such as SSRIs or SNRIs, buspirone.   I believe that patient is stable for discharge at this time.  Patient will be discharged with discharge instructions, return precautions, alprazolam refill for a few days.  All questions answered to their satisfaction.    Social Determinants of Health: Patient lives independently   Disposition:       Co morbidities that complicate the patient evaluation  Past Medical History:  Diagnosis Date  History of kidney stones    Hypertension      Medicines Meds ordered this encounter  Medications   ondansetron (ZOFRAN) 4 MG/5ML solution 4 mg   oxyCODONE (Oxy IR/ROXICODONE) immediate release tablet 5 mg   cyanocobalamin (VITAMIN B12) injection 1,000 mcg   folic acid (FOLVITE) tablet 1 mg   ALPRAZolam (XANAX) tablet 0.25 mg   ALPRAZolam (XANAX) 0.25 MG tablet    Sig: Take 1 tablet (0.25 mg total) by mouth 2 (two) times daily for 4 days.    Dispense:  8 tablet    Refill:  0    I have reviewed the patients home medicines and have made adjustments as needed  Problem List / ED Course: Problem List Items Addressed This Visit   None Visit Diagnoses     Urinary frequency    -  Primary   Paresthesia of lower extremity                       This note was created using dictation software, which may contain spelling or grammatical errors.    Loetta Rough, MD 12/14/22 [phone]

## 2022-12-03 LAB — URINE CULTURE: Culture: <10000 — AB

## 2022-12-04 DIAGNOSIS — E782 Mixed hyperlipidemia: Secondary | ICD-10-CM | POA: Diagnosis not present

## 2022-12-04 DIAGNOSIS — I10 Essential (primary) hypertension: Secondary | ICD-10-CM | POA: Diagnosis not present

## 2022-12-04 DIAGNOSIS — E039 Hypothyroidism, unspecified: Secondary | ICD-10-CM | POA: Diagnosis not present

## 2022-12-04 DIAGNOSIS — R7309 Other abnormal glucose: Secondary | ICD-10-CM | POA: Diagnosis not present

## 2022-12-04 DIAGNOSIS — D518 Other vitamin B12 deficiency anemias: Secondary | ICD-10-CM | POA: Diagnosis not present

## 2022-12-04 DIAGNOSIS — N183 Chronic kidney disease, stage 3 unspecified: Secondary | ICD-10-CM | POA: Diagnosis not present

## 2022-12-04 DIAGNOSIS — Z681 Body mass index (BMI) 19 or less, adult: Secondary | ICD-10-CM | POA: Diagnosis not present

## 2022-12-04 DIAGNOSIS — E559 Vitamin D deficiency, unspecified: Secondary | ICD-10-CM | POA: Diagnosis not present

## 2022-12-18 ENCOUNTER — Encounter (INDEPENDENT_AMBULATORY_CARE_PROVIDER_SITE_OTHER): Payer: Self-pay | Admitting: *Deleted

## 2022-12-18 DIAGNOSIS — R110 Nausea: Secondary | ICD-10-CM | POA: Diagnosis not present

## 2022-12-18 DIAGNOSIS — R531 Weakness: Secondary | ICD-10-CM | POA: Diagnosis not present

## 2022-12-26 ENCOUNTER — Encounter (HOSPITAL_COMMUNITY): Payer: Self-pay | Admitting: Radiology

## 2022-12-26 ENCOUNTER — Emergency Department (HOSPITAL_COMMUNITY)
Admission: EM | Admit: 2022-12-26 | Discharge: 2022-12-26 | Disposition: A | Discharge status: Home / Self Care | Payer: Medicare Other | Attending: Emergency Medicine | Admitting: Emergency Medicine

## 2022-12-26 ENCOUNTER — Emergency Department (HOSPITAL_COMMUNITY): Payer: Medicare Other

## 2022-12-26 ENCOUNTER — Other Ambulatory Visit: Payer: Self-pay

## 2022-12-26 DIAGNOSIS — R1084 Generalized abdominal pain: Secondary | ICD-10-CM | POA: Diagnosis not present

## 2022-12-26 DIAGNOSIS — K5900 Constipation, unspecified: Secondary | ICD-10-CM | POA: Insufficient documentation

## 2022-12-26 DIAGNOSIS — R413 Other amnesia: Secondary | ICD-10-CM | POA: Insufficient documentation

## 2022-12-26 DIAGNOSIS — N183 Chronic kidney disease, stage 3 unspecified: Secondary | ICD-10-CM | POA: Diagnosis not present

## 2022-12-26 DIAGNOSIS — Z79899 Other long term (current) drug therapy: Secondary | ICD-10-CM | POA: Diagnosis not present

## 2022-12-26 DIAGNOSIS — R5381 Other malaise: Secondary | ICD-10-CM | POA: Diagnosis not present

## 2022-12-26 DIAGNOSIS — I10 Essential (primary) hypertension: Secondary | ICD-10-CM | POA: Diagnosis not present

## 2022-12-26 DIAGNOSIS — Z1152 Encounter for screening for COVID-19: Secondary | ICD-10-CM | POA: Insufficient documentation

## 2022-12-26 DIAGNOSIS — Z681 Body mass index (BMI) 19 or less, adult: Secondary | ICD-10-CM | POA: Diagnosis not present

## 2022-12-26 DIAGNOSIS — E860 Dehydration: Secondary | ICD-10-CM | POA: Diagnosis not present

## 2022-12-26 DIAGNOSIS — R109 Unspecified abdominal pain: Secondary | ICD-10-CM | POA: Diagnosis not present

## 2022-12-26 LAB — CBC WITH DIFFERENTIAL/PLATELET
Abs Immature Granulocytes: 0.01 10*3/uL (ref 0.00–0.07)
Basophils Absolute: 0 10*3/uL (ref 0.0–0.1)
Basophils Relative: 0 %
Eosinophils Absolute: 0.1 10*3/uL (ref 0.0–0.5)
Eosinophils Relative: 1 %
HCT: 40.6 % (ref 36.0–46.0)
Hemoglobin: 13 g/dL (ref 12.0–15.0)
Immature Granulocytes: 0 %
Lymphocytes Relative: 18 %
Lymphs Abs: 0.9 10*3/uL (ref 0.7–4.0)
MCH: 29.2 pg (ref 26.0–34.0)
MCHC: 32 g/dL (ref 30.0–36.0)
MCV: 91.2 fL (ref 80.0–100.0)
Monocytes Absolute: 0.4 10*3/uL (ref 0.1–1.0)
Monocytes Relative: 8 %
Neutro Abs: 3.9 10*3/uL (ref 1.7–7.7)
Neutrophils Relative %: 73 %
Platelets: 190 10*3/uL (ref 150–400)
RBC: 4.45 MIL/uL (ref 3.87–5.11)
RDW: 14 % (ref 11.5–15.5)
WBC: 5.3 10*3/uL (ref 4.0–10.5)
nRBC: 0 % (ref 0.0–0.2)

## 2022-12-26 LAB — COMPREHENSIVE METABOLIC PANEL
ALT: 29 U/L (ref 0–44)
AST: 24 U/L (ref 15–41)
Albumin: 4.1 g/dL (ref 3.5–5.0)
Alkaline Phosphatase: 52 U/L (ref 38–126)
Anion gap: 9 (ref 5–15)
BUN: 15 mg/dL (ref 8–23)
CO2: 29 mmol/L (ref 22–32)
Calcium: 10.4 mg/dL — ABNORMAL HIGH (ref 8.9–10.3)
Chloride: 103 mmol/L (ref 98–111)
Creatinine, Ser: 0.99 mg/dL (ref 0.44–1.00)
GFR, Estimated: 57 mL/min — ABNORMAL LOW (ref >60)
Glucose, Bld: 112 mg/dL — ABNORMAL HIGH (ref 70–99)
Potassium: 4 mmol/L (ref 3.5–5.1)
Sodium: 141 mmol/L (ref 135–145)
Total Bilirubin: 1.7 mg/dL — ABNORMAL HIGH (ref 0.3–1.2)
Total Protein: 6.6 g/dL (ref 6.5–8.1)

## 2022-12-26 LAB — URINALYSIS, ROUTINE W REFLEX MICROSCOPIC
Bacteria, UA: NONE SEEN
Bilirubin Urine: NEGATIVE
Glucose, UA: NEGATIVE mg/dL
Hgb urine dipstick: NEGATIVE
Ketones, ur: 5 mg/dL — AB
Leukocytes,Ua: NEGATIVE
Nitrite: NEGATIVE
Protein, ur: 30 mg/dL — AB
Specific Gravity, Urine: 1.013 (ref 1.005–1.030)
pH: 7 (ref 5.0–8.0)

## 2022-12-26 LAB — LIPASE, BLOOD: Lipase: 48 U/L (ref 11–51)

## 2022-12-26 LAB — TSH: TSH: 1.207 u[IU]/mL (ref 0.350–4.500)

## 2022-12-26 LAB — SARS CORONAVIRUS 2 BY RT PCR: SARS Coronavirus 2 by RT PCR: NEGATIVE

## 2022-12-26 LAB — MAGNESIUM: Magnesium: 1.9 mg/dL (ref 1.7–2.4)

## 2022-12-26 MED ORDER — SODIUM CHLORIDE 0.9 % IV BOLUS
500.0000 mL | Freq: Once | INTRAVENOUS | Status: AC
  Administered 2022-12-26: 500 mL via INTRAVENOUS

## 2022-12-26 MED ORDER — BISACODYL 10 MG RE SUPP
10.0000 mg | Freq: Once | RECTAL | Status: AC
  Administered 2022-12-26: 10 mg via RECTAL
  Filled 2022-12-26: qty 1

## 2022-12-26 MED ORDER — HYDRALAZINE HCL 25 MG PO TABS
50.0000 mg | ORAL_TABLET | Freq: Once | ORAL | Status: AC
  Administered 2022-12-26: 50 mg via ORAL
  Filled 2022-12-26: qty 2

## 2022-12-26 MED ORDER — ONDANSETRON HCL 4 MG/2ML IJ SOLN
4.0000 mg | Freq: Once | INTRAMUSCULAR | Status: AC
  Administered 2022-12-26: 4 mg via INTRAVENOUS
  Filled 2022-12-26: qty 2

## 2022-12-26 MED ORDER — IOHEXOL 300 MG/ML  SOLN
100.0000 mL | Freq: Once | INTRAMUSCULAR | Status: AC | PRN
  Administered 2022-12-26: 80 mL via INTRAVENOUS

## 2022-12-26 NOTE — ED Triage Notes (Signed)
Pt presents after being sent by her PCP for possible dehydration and states she needed lab work.  Pt reports feeling nauseated, no vomiting, constipation, and poor po intake. Just feels "terrible"  No fever or cough.

## 2022-12-26 NOTE — ED Notes (Signed)
Pt ambulated to the bathroom to void.   

## 2022-12-26 NOTE — ED Provider Notes (Signed)
[place] EMERGENCY DEPARTMENT AT [facility] Provider Note   CSN: [id number] Arrival date & time: 12/26/22  4098     History {Add pertinent medical, surgical, social history, OB history to HPI:1} Chief Complaint  Patient presents with   Nausea   Constipation    [name] is a 82 y.o. female.   Constipation Associated symptoms: abdominal pain and nausea   Associated symptoms: no back pain, no diarrhea, no dysuria, no fever and no vomiting        [name] is a 82 y.o. female past medical history of hypertension and recurrent kidney stones, who presents to the Emergency Department from her PCPs office for evaluation of possible constipation and dehydration.  Patient states that she "feels terrible".  Symptoms present for several days.  Family member at bedside endorses decreased appetite with persistent nausea.  Patient reports last bowel movement greater than 1 week ago.  Has tried over-the-counter fiber supplements without relief.  Patient denies any vomiting, fever or chills, no cough, chest pain headache or shortness of breath.  Family member endorses frequent confusion and memory loss.  States episodes are becoming more frequent and are typically brief in duration.  PCP was concerned for possible dehydration  Home Medications Prior to Admission medications   Medication Sig Start Date End Date Taking? Authorizing Provider  acetaminophen (TYLENOL) 500 MG tablet Take 1,000 mg by mouth daily as needed for fever.    [provider]  albuterol (VENTOLIN HFA) 108 (90 Base) MCG/ACT inhaler Inhale 2 puffs into the lungs every 6 (six) hours as needed for wheezing or shortness of breath. 04/16/20   Shon Hale, MD  ascorbic acid (VITAMIN C) 500 MG tablet Take 1 tablet (500 mg total) by mouth daily. 04/17/20   Shon Hale, MD  atorvastatin (LIPITOR) 10 MG tablet Take 10 mg by mouth daily.     [provider]  carvedilol (COREG) 3.125 MG  tablet Take 3.125 mg by mouth 2 (two) times daily with a meal.    [provider]  feeding supplement, ENSURE ENLIVE, (ENSURE ENLIVE) LIQD Take 237 mLs by mouth 2 (two) times daily between meals. 04/16/20   Shon Hale, MD  guaiFENesin-dextromethorphan (ROBITUSSIN DM) 100-10 MG/5ML syrup Take 10 mLs by mouth every 4 (four) hours as needed for cough. 04/16/20   Shon Hale, MD  hydrALAZINE (APRESOLINE) 25 MG tablet Take 25 mg by mouth 3 (three) times daily. 03/03/19   [provider]  losartan (COZAAR) 100 MG tablet Take 1 tablet (100 mg total) by mouth daily. 04/16/20   Shon Hale, MD  mirtazapine (REMERON) 15 MG tablet Take 0.5 tablets (7.5 mg total) by mouth at bedtime. 04/16/20   Shon Hale, MD  oxybutynin (DITROPAN) 5 MG tablet Take 5-10 mg by mouth daily. 01/28/20   [provider]  predniSONE (DELTASONE) 20 MG tablet Take 2 tablets (40 mg total) by mouth daily with breakfast. 04/16/20   Shon Hale, MD  zinc sulfate 220 (50 Zn) MG capsule Take 1 capsule (220 mg total) by mouth daily. 04/17/20   Shon Hale, MD      Allergies    Patient has no known allergies.    Review of Systems   Review of Systems  Constitutional:  Positive for appetite change and fatigue. Negative for chills and fever.  HENT:  Negative for congestion, sore throat and trouble swallowing.   Respiratory:  Negative for cough and shortness of breath.   Cardiovascular:  Negative for  chest pain.  Gastrointestinal:  Positive for abdominal pain, constipation and nausea. Negative for diarrhea and vomiting.  Genitourinary:  Positive for frequency. Negative for decreased urine volume, difficulty urinating, dysuria and flank pain.  Musculoskeletal:  Negative for arthralgias and back pain.  Skin:  Negative for rash and wound.  Neurological:  Positive for weakness. Negative for dizziness, seizures, syncope, speech difficulty, numbness and headaches.    Physical Exam Updated  Vital Signs BP (!) 197/93 (BP Location: Right Arm)   Pulse 76   Temp 99.1 F (37.3 C) (Oral)   Resp 16   SpO2 96%  Physical Exam Vitals and nursing note reviewed.  Constitutional:      General: She is not in acute distress.    Appearance: Normal appearance. She is not ill-appearing or toxic-appearing.  Cardiovascular:     Rate and Rhythm: Normal rate and regular rhythm.     Pulses: Normal pulses.  Pulmonary:     Effort: Pulmonary effort is normal.     Breath sounds: Normal breath sounds.  Abdominal:     General: There is no distension.     Palpations: Abdomen is soft.     Tenderness: There is no abdominal tenderness. There is no right CVA tenderness or left CVA tenderness.  Musculoskeletal:     Cervical back: Normal range of motion. No tenderness.     Right lower leg: No edema.     Left lower leg: No edema.  Skin:    General: Skin is warm.     Capillary Refill: Capillary refill takes less than 2 seconds.  Neurological:     General: No focal deficit present.     Mental Status: She is alert.     Sensory: No sensory deficit.     Motor: No weakness.     ED Results / Procedures / Treatments   Labs (all labs ordered are listed, but only abnormal results are displayed) Labs Reviewed  COMPREHENSIVE METABOLIC PANEL - Abnormal; Notable for the following components:      Result Value   Glucose, Bld 112 (*)    Calcium 10.4 (*)    Total Bilirubin 1.7 (*)    GFR, Estimated 57 (*)    All other components within normal limits  URINALYSIS, ROUTINE W REFLEX MICROSCOPIC - Abnormal; Notable for the following components:   APPearance CLOUDY (*)    Ketones, ur 5 (*)    Protein, ur 30 (*)    All other components within normal limits  SARS CORONAVIRUS 2 BY RT PCR  LIPASE, BLOOD  CBC WITH DIFFERENTIAL/PLATELET    EKG EKG Interpretation  Date/Time:  Friday December 26 2022 12:43:32 EDT Ventricular Rate:  71 PR Interval:  202 QRS Duration: 129 QT Interval:  407 QTC  Calculation: 443 R Axis:   91 Text Interpretation: Sinus rhythm Right bundle branch block Baseline wander in lead(s) II new RBBB since prior 2/20 Confirmed by Meridee Score [phone]) on 12/26/2022 12:50:09 PM  Radiology DG Chest Portable 1 View  Result Date: 12/26/2022 CLINICAL DATA:  Malaise. EXAM: PORTABLE CHEST 1 VIEW COMPARISON:  April 14, 2020. FINDINGS: The heart size and mediastinal contours are within normal limits. Both lungs are clear. The visualized skeletal structures are unremarkable. IMPRESSION: No active disease. Electronically Signed   By: Lupita Stephens M.D.   On: 12/26/2022 11:53    Procedures Procedures  {Document cardiac monitor, telemetry assessment procedure when appropriate:1}  Medications Ordered in ED Medications  sodium chloride 0.9 % bolus 500 mL (has  no administration in time range)  ondansetron (ZOFRAN) injection 4 mg (has no administration in time range)    ED Course/ Medical Decision Making/ A&P   {   Click here for ABCD2, HEART and other calculatorsREFRESH Note before signing :1}                          Medical Decision Making Patient here from PCPs office for evaluation of generalized malaise, weakness, nausea and abdominal pain.  Reports last bowel movement 1 week ago.  No improvement with over-the-counter medications.  Family member at bedside also provides additional history.  Reports increasing confusion with memory loss.  Patient denies fever, cough, burning with urination headaches and dizziness.  Amount and/or Complexity of Data Reviewed Labs: ordered. Radiology: ordered.  Risk Prescription drug management.   ***  {Document critical care time when appropriate:1} {Document review of labs and clinical decision tools ie heart score, Chads2Vasc2 etc:1}  {Document your independent review of radiology images, and any outside records:1} {Document your discussion with family members, caretakers, and with consultants:1} {Document social  determinants of health affecting pt's care:1} {Document your decision making why or why not admission, treatments were needed:1} Final Clinical Impression(s) / ED Diagnoses Final diagnoses:  None    Rx / DC Orders ED Discharge Orders     None

## 2022-12-26 NOTE — ED Notes (Signed)
Call to lab to add urine culture to urine already sent to lab

## 2022-12-26 NOTE — Discharge Instructions (Addendum)
As discussed, your workup today shows that you are constipated.  You have been given a suppository here to help with this.  I recommend that you take over-the-counter MiraLAX, 1 heaping capful mixed in 8 to 10 ounces of water or juice 1-2 times daily until your constipation improves.  Is important that you continue to drink fluids.  Also, there were some CT findings of your pancreas and spleen that may need additional imaging.  Your primary care provider can arrange this for you.  Please contact Dr. Sherwood Gambler to arrange follow-up appointment.  Return emergency department for any new or worsening symptoms.

## 2022-12-26 NOTE — ED Provider Triage Note (Signed)
Emergency Medicine Provider Triage Evaluation Note  [name] , a 82 y.o. female  was evaluated in triage.  Pt complains of feeling generally unwell for the past week with nausea and fatigue, no fevers or chills.  She has been constipated not have a bowel movement in the past week.  Review of Systems  Positive: Nausea, fatigue, constipation Negative: Vomiting, fevers  Physical Exam  BP (!) 197/93 (BP Location: Right Arm)   Pulse 76   Temp 99.1 F (37.3 C) (Oral)   Resp 16   SpO2 96%  Gen:   Awake, no distress   Resp:  Normal effort  MSK:   Moves extremities without difficulty  Other:  Heart RRR  Medical Decision Making  Medically screening exam initiated at 11:15 AM.  Appropriate orders placed.  [name] was informed that the remainder of the evaluation will be completed by another provider, this initial triage assessment does not replace that evaluation, and the importance of remaining in the ED until their evaluation is complete.     Ma Rings, [place] 12/26/22 1116

## 2022-12-27 LAB — URINE CULTURE

## 2022-12-30 ENCOUNTER — Ambulatory Visit (INDEPENDENT_AMBULATORY_CARE_PROVIDER_SITE_OTHER): Payer: Medicare Other | Admitting: Gastroenterology

## 2022-12-30 ENCOUNTER — Encounter (INDEPENDENT_AMBULATORY_CARE_PROVIDER_SITE_OTHER): Payer: Self-pay | Admitting: Gastroenterology

## 2022-12-30 VITALS — BP 162/90 | Temp 98.3°F | Ht 70.0 in | Wt 127.0 lb

## 2022-12-30 DIAGNOSIS — D7389 Other diseases of spleen: Secondary | ICD-10-CM

## 2022-12-30 DIAGNOSIS — K5900 Constipation, unspecified: Secondary | ICD-10-CM

## 2022-12-30 DIAGNOSIS — R110 Nausea: Secondary | ICD-10-CM | POA: Diagnosis not present

## 2022-12-30 DIAGNOSIS — K869 Disease of pancreas, unspecified: Secondary | ICD-10-CM | POA: Diagnosis not present

## 2022-12-30 DIAGNOSIS — R634 Abnormal weight loss: Secondary | ICD-10-CM | POA: Diagnosis not present

## 2022-12-30 DIAGNOSIS — R194 Change in bowel habit: Secondary | ICD-10-CM

## 2022-12-30 MED ORDER — PEG 3350-KCL-NA BICARB-NACL 420 G PO SOLR: 4000.0000 mL | Freq: Once | ORAL | 0 refills | Status: AC

## 2022-12-30 MED ORDER — LORAZEPAM 0.5 MG PO TABS: ORAL_TABLET | ORAL | 0 refills | Status: AC

## 2022-12-30 NOTE — Patient Instructions (Addendum)
I am going to send a bowel prep for you to take to get you cleaned out Given your weight loss and constipation, I think we should schedule you for a colonoscopy Once the bowel prep is completed please restart miralax doing 1 capful every 8 hours Increase water intake, aim for atleast 64 oz per day Increase fruits, veggies and whole grains, kiwi and prunes are especially good for constipation I will order an MRI for further evaluation of the findings on your recent CT scan, someone will reach out to you regarding a time for this I have sent a dose of ativan to be take prior to her MRI, DO NOT take xanax when taking the ativan  Follow up 3 months

## 2022-12-30 NOTE — Progress Notes (Addendum)
Referring Provider: Assunta Found, MD Primary Care Physician:  Elfredia Nevins, MD Primary GI Physician: new   Chief Complaint  Patient presents with   Nausea    Referred for nausea. Has not had BM in almost two weeks. Taking zofran every 8 hours. Taking one heaping spoonful bid. Having some pain in lower abdomen. Having trouble eating due nausea and feeling full.    HPI:   [name] is a 82 y.o. female with past medical history of kidney stones, HTN, anxiety   Patient presenting today for nausea, constipation  Seen in the ED on 6/7 with CT A/P with contrast that showed No acute abnormality in the abdomen or pelvis. Small to moderate volume of stool. Subtle, mildly hyperenhancing lobulated focus within the medial spleen measures 3.7 x 3.0 cm. The focus appears isoattenuating on the delayed images. This is favored to represent a benign hemangioma. Recommend further evaluation with nonemergent MRI of the abdomen with and without contrast. A 5 mm hypoattenuating focus in the pancreatic head, likely a side branch IPMN. No main ductal dilation. This finding can be assessed at the same time on the above recommended MRI.  CT head at that time also without focal lesions to suggest cause of her nausea  TSH at that time 1.207, CBC WNL, CMP with T bili 1.7 (chornically elevated per chart review), calcium 10.4, lipase 48   Present:  Patient arrives with grand daughter who reports that patient has had issues with nausea and constipation. She has not had a good BM in about 2 weeks. She had one small BM with harder stool balls. They report that she was told to do a heaping capful of miralax BID which has not helped much. She had a BM in the hospital after she was given a suppository. Prior to this beginning, she did not have a BM daily. She feels that she has always had some issues with harder stools and constipation. No recent changes in medications or diet. Her grand daughter reports her water  intake is good, about 5 bottles per day. She does some pedialyte/powerade intermittently as well. She was not taking anything for constipation prior to her feeling bad. She has some pain in her lower abdomen. She has not vomited but feels nauseated, is taking zofran q8h.   Over the past year she has lost 20 pounds unintentionally. She notes she always feels full. She denies early satiety. No rectal bleeding or melena.   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  Last Endoscopy: never   Recommendations:    Past Medical History:  Diagnosis Date   History of kidney stones    Hypertension     Past Surgical History:  Procedure Laterality Date   CATARACT EXTRACTION W/PHACO Left 05/02/2019   Procedure: CATARACT EXTRACTION PHACO AND INTRAOCULAR LENS PLACEMENT (IOC);  Surgeon: Fabio Pierce, MD;  Location: [facility];  Service: Ophthalmology;  Laterality: Left;  CDE: 27.19   CATARACT EXTRACTION W/PHACO Right 05/16/2019   Procedure: CATARACT EXTRACTION PHACO AND INTRAOCULAR LENS PLACEMENT RIGHT EYE;  Surgeon: Fabio Pierce, MD;  Location: [facility];  Service: Ophthalmology;  Laterality: Right;  CDE: 18.89   CESAREAN SECTION      Current Outpatient Medications  Medication Sig Dispense Refill   acetaminophen (TYLENOL) 500 MG tablet Take 1,000 mg by mouth daily as needed for fever.  ALPRAZolam (XANAX) 0.25 MG tablet Take 0.25 mg by mouth. One daily     atorvastatin (LIPITOR) 10 MG tablet Take 10 mg by mouth daily.      carvedilol (COREG) 3.125 MG tablet Take 3.125 mg by mouth 2 (two) times daily with a meal.     Cyanocobalamin (B-12 COMPLIANCE INJECTION IJ) Inject as directed. One monthly     guaiFENesin-dextromethorphan (ROBITUSSIN DM) 100-10 MG/5ML syrup Take 10 mLs by mouth every 4 (four) hours as needed for cough. 118 mL 0   hydrALAZINE (APRESOLINE) 50 MG  tablet Take 50 mg by mouth 3 (three) times daily.     losartan (COZAAR) 100 MG tablet Take 1 tablet (100 mg total) by mouth daily. 30 tablet 3   ondansetron (ZOFRAN-ODT) 8 MG disintegrating tablet Take 8 mg by mouth every 8 (eight) hours as needed.     zinc sulfate 220 (50 Zn) MG capsule Take 1 capsule (220 mg total) by mouth daily. 30 capsule 1   feeding supplement, ENSURE ENLIVE, (ENSURE ENLIVE) LIQD Take 237 mLs by mouth 2 (two) times daily between meals. (Patient not taking: Reported on 12/30/2022) 2370 mL 1   No current facility-administered medications for this visit.    Allergies as of 12/30/2022   (No Known Allergies)    No family history on file.  Social History   Socioeconomic History   Marital status: Married    Spouse name: Not on file   Number of children: Not on file   Years of education: Not on file   Highest education level: Not on file  Occupational History   Not on file  Tobacco Use   Smoking status: Former   Smokeless tobacco: Never  Vaping Use   Vaping Use: Not on file  Substance and Sexual Activity   Alcohol use: No   Drug use: Never   Sexual activity: Not on file  Other Topics Concern   Not on file  Social History Narrative   Not on file   Social Determinants of Health   Financial Resource Strain: Not on file  Food Insecurity: Not on file  Transportation Needs: Not on file  Physical Activity: Not on file  Stress: Not on file  Social Connections: Not on file   Review of systems General: negative for malaise, night sweats, fever, chills Neck: Negative for lumps, goiter, pain and significant neck swelling Resp: Negative for cough, wheezing, dyspnea at rest CV: Negative for chest pain, leg swelling, palpitations, orthopnea GI: denies melena, hematochezia, vomiting, diarrhea, dysphagia, odyonophagia, early satiety +weight loss +nausea +constipation  MSK: Negative for joint pain or swelling, back pain, and muscle pain. Derm: Negative for itching  or rash Psych: Denies depression, anxiety, memory loss, confusion. No homicidal or suicidal ideation.  Heme: Negative for prolonged bleeding, bruising easily, and swollen nodes. Endocrine: Negative for cold or heat intolerance, polyuria, polydipsia and goiter. Neuro: negative for tremor, gait imbalance, syncope and seizures. The remainder of the review of systems is noncontributory.  Physical Exam: There were no vitals taken for this visit. General:   Alert and oriented. No distress noted. Pleasant and cooperative.  Head:  Normocephalic and atraumatic. Eyes:  Conjuctiva clear without scleral icterus. Mouth:  Oral mucosa pink and moist. Good dentition. No lesions. Heart: Normal rate and rhythm, s1 and s2 heart sounds present.  Lungs: Clear lung sounds in all lobes. Respirations equal and unlabored. Abdomen:  +BS, soft, non-tender and non-distended. No rebound or guarding. No HSM or masses noted. Rectal: Alain Honey,  LPN present for exam. No obvious masses or lesions, sphincter tone WNL, some harder stool noted in rectal vault. Patient tolerated exam without issue Derm: No palmar erythema or jaundice Msk:  Symmetrical without gross deformities. Normal posture. Extremities:  Without edema. Neurologic:  Alert and  oriented x4 Psych:  Alert and cooperative. Normal mood and affect.  Invalid input(s): "6 MONTHS"   ASSESSMENT: ANAROSE LABICHE is a 82 y.o. female presenting today as a new patient for nausea and constipation  Nausea and constipation: appear to have began around the same time. She notes she has never had a BM everyday but no issues with significant constipation in the past where she required any therapy to have a BM. Began with significant constipation a few weeks ago as well as nausea. CT A/P done in the ED earlier this month with moderate volume stool burden. CMP, TSH WNL. No recent medication changes, last TCS 2011. At this time, unclear why she is having a change in her bowel  habits. Rectal exam with stool in rectal vault but no other obvious abnormalities. I suspect nausea is secondary to her ongoing constipation as she has not had a sufficient BM in about 2 weeks. Notably with 20 pounds weight loss over the past year. At this time, would recommend bowel prep to clean her out then resume miralax 1 capful every 8 hours. I am recommending proceeding with a colonoscopy to rule out other underlying causes of her signficant constipation and weight loss. Indications, risks and benefits of procedure discussed in detail with patient and her grand daughter who verbalized understanding and are in agreement to proceed with Colonoscopy.   Notably, CT A/P during ED visit also with findings of Subtle, mildly hyperenhancing lobulated focus within the medial spleen measures 3.7 x 3.0 cm (possibly hemangioma) as well as a 5 mm hypoattenuating focus in the pancreatic head, likely a side branch IPMN. No main ductal dilation. Recommend follow up MRI for further evaluation of these lesions. Will order MR Abdomen w wo. Patient has history of significant anxiety, will provide 0.25mg  ativan to take 1 hour prior to exam, can repeat dose of 0.25mg  ativan 30 minutes prior to MRI if needed.    PLAN:  MR abd w wo (ativan 0.25mg  1 hour prior, repeat 30 minutes prior PRN) 2. Bowel prep   3. Schedule Colonoscopy-ASA III 4. Resume miralax 1 capful TID after bowel prep 5. Continue with good water intake, diet high in fruits, veggies, whole grains 6. Can continue zofran 4mg  Q8h PRN for nausea  All questions were answered, patient verbalized understanding and is in agreement with plan as outlined above.   Follow Up: 3 months   Laconya Clere L. Jeanmarie Hubert, MSN, APRN, AGNP-C Adult-Gerontology Nurse Practitioner [facility]  I have reviewed the note and agree with the APP's assessment as described in this progress note  Katrinka Blazing, MD Gastroenterology and Hepatology [facility] Gastroenterology

## 2022-12-31 ENCOUNTER — Encounter (INDEPENDENT_AMBULATORY_CARE_PROVIDER_SITE_OTHER): Payer: Self-pay

## 2022-12-31 DIAGNOSIS — K5900 Constipation, unspecified: Secondary | ICD-10-CM | POA: Insufficient documentation

## 2022-12-31 DIAGNOSIS — R634 Abnormal weight loss: Secondary | ICD-10-CM | POA: Insufficient documentation

## 2022-12-31 DIAGNOSIS — K869 Disease of pancreas, unspecified: Secondary | ICD-10-CM | POA: Insufficient documentation

## 2022-12-31 DIAGNOSIS — R110 Nausea: Secondary | ICD-10-CM | POA: Insufficient documentation

## 2022-12-31 DIAGNOSIS — R194 Change in bowel habit: Secondary | ICD-10-CM | POA: Insufficient documentation

## 2022-12-31 DIAGNOSIS — D7389 Other diseases of spleen: Secondary | ICD-10-CM | POA: Insufficient documentation

## 2023-01-01 ENCOUNTER — Observation Stay (HOSPITAL_COMMUNITY)
Admission: EM | Admit: 2023-01-01 | Discharge: 2023-01-02 | Disposition: A | Discharge status: Home / Self Care | Payer: Medicare Other | Attending: Internal Medicine | Admitting: Internal Medicine

## 2023-01-01 ENCOUNTER — Observation Stay (HOSPITAL_COMMUNITY): Payer: Medicare Other

## 2023-01-01 ENCOUNTER — Emergency Department (HOSPITAL_COMMUNITY): Payer: Medicare Other

## 2023-01-01 ENCOUNTER — Encounter (HOSPITAL_COMMUNITY): Payer: Self-pay | Admitting: Internal Medicine

## 2023-01-01 ENCOUNTER — Other Ambulatory Visit: Payer: Self-pay

## 2023-01-01 DIAGNOSIS — Z743 Need for continuous supervision: Secondary | ICD-10-CM | POA: Diagnosis not present

## 2023-01-01 DIAGNOSIS — G9341 Metabolic encephalopathy: Principal | ICD-10-CM

## 2023-01-01 DIAGNOSIS — Z1152 Encounter for screening for COVID-19: Secondary | ICD-10-CM | POA: Diagnosis not present

## 2023-01-01 DIAGNOSIS — Z87891 Personal history of nicotine dependence: Secondary | ICD-10-CM | POA: Diagnosis not present

## 2023-01-01 DIAGNOSIS — K8689 Other specified diseases of pancreas: Secondary | ICD-10-CM | POA: Insufficient documentation

## 2023-01-01 DIAGNOSIS — R410 Disorientation, unspecified: Secondary | ICD-10-CM | POA: Diagnosis not present

## 2023-01-01 DIAGNOSIS — M6281 Muscle weakness (generalized): Secondary | ICD-10-CM | POA: Insufficient documentation

## 2023-01-01 DIAGNOSIS — R531 Weakness: Secondary | ICD-10-CM

## 2023-01-01 DIAGNOSIS — N281 Cyst of kidney, acquired: Secondary | ICD-10-CM | POA: Diagnosis not present

## 2023-01-01 DIAGNOSIS — K5900 Constipation, unspecified: Secondary | ICD-10-CM | POA: Insufficient documentation

## 2023-01-01 DIAGNOSIS — D7389 Other diseases of spleen: Secondary | ICD-10-CM | POA: Insufficient documentation

## 2023-01-01 DIAGNOSIS — R634 Abnormal weight loss: Secondary | ICD-10-CM | POA: Diagnosis not present

## 2023-01-01 DIAGNOSIS — R194 Change in bowel habit: Secondary | ICD-10-CM | POA: Diagnosis present

## 2023-01-01 DIAGNOSIS — R2689 Other abnormalities of gait and mobility: Secondary | ICD-10-CM | POA: Insufficient documentation

## 2023-01-01 DIAGNOSIS — R2681 Unsteadiness on feet: Secondary | ICD-10-CM | POA: Diagnosis not present

## 2023-01-01 DIAGNOSIS — I10 Essential (primary) hypertension: Secondary | ICD-10-CM | POA: Insufficient documentation

## 2023-01-01 DIAGNOSIS — R935 Abnormal findings on diagnostic imaging of other abdominal regions, including retroperitoneum: Secondary | ICD-10-CM | POA: Diagnosis not present

## 2023-01-01 DIAGNOSIS — R509 Fever, unspecified: Secondary | ICD-10-CM | POA: Diagnosis not present

## 2023-01-01 DIAGNOSIS — R110 Nausea: Secondary | ICD-10-CM | POA: Diagnosis present

## 2023-01-01 DIAGNOSIS — K8020 Calculus of gallbladder without cholecystitis without obstruction: Secondary | ICD-10-CM | POA: Diagnosis not present

## 2023-01-01 DIAGNOSIS — K869 Disease of pancreas, unspecified: Secondary | ICD-10-CM | POA: Diagnosis present

## 2023-01-01 DIAGNOSIS — K5730 Diverticulosis of large intestine without perforation or abscess without bleeding: Secondary | ICD-10-CM | POA: Diagnosis not present

## 2023-01-01 LAB — BASIC METABOLIC PANEL
Anion gap: 8 (ref 5–15)
BUN: 10 mg/dL (ref 8–23)
CO2: 29 mmol/L (ref 22–32)
Calcium: 9.4 mg/dL (ref 8.9–10.3)
Chloride: 101 mmol/L (ref 98–111)
Creatinine, Ser: 0.89 mg/dL (ref 0.44–1.00)
GFR, Estimated: >60 mL/min (ref >60)
Glucose, Bld: 138 mg/dL — ABNORMAL HIGH (ref 70–99)
Potassium: 3.2 mmol/L — ABNORMAL LOW (ref 3.5–5.1)
Sodium: 138 mmol/L (ref 135–145)

## 2023-01-01 LAB — URINALYSIS, ROUTINE W REFLEX MICROSCOPIC
Bacteria, UA: NONE SEEN
Bilirubin Urine: NEGATIVE
Glucose, UA: 50 mg/dL — AB
Ketones, ur: 5 mg/dL — AB
Leukocytes,Ua: NEGATIVE
Nitrite: NEGATIVE
Protein, ur: 30 mg/dL — AB
Specific Gravity, Urine: 1.006 (ref 1.005–1.030)
pH: 8 (ref 5.0–8.0)

## 2023-01-01 LAB — CBC
HCT: 41.9 % (ref 36.0–46.0)
Hemoglobin: 13.3 g/dL (ref 12.0–15.0)
MCH: 29 pg (ref 26.0–34.0)
MCHC: 31.7 g/dL (ref 30.0–36.0)
MCV: 91.3 fL (ref 80.0–100.0)
Platelets: 177 10*3/uL (ref 150–400)
RBC: 4.59 MIL/uL (ref 3.87–5.11)
RDW: 14.1 % (ref 11.5–15.5)
WBC: 8.4 10*3/uL (ref 4.0–10.5)
nRBC: 0 % (ref 0.0–0.2)

## 2023-01-01 LAB — RAPID URINE DRUG SCREEN, HOSP PERFORMED
Amphetamines: NOT DETECTED
Barbiturates: NOT DETECTED
Benzodiazepines: NOT DETECTED
Cocaine: NOT DETECTED
Opiates: NOT DETECTED
Tetrahydrocannabinol: NOT DETECTED

## 2023-01-01 LAB — TROPONIN I (HIGH SENSITIVITY)
Troponin I (High Sensitivity): 13 ng/L (ref <18)
Troponin I (High Sensitivity): 12 ng/L (ref <18)

## 2023-01-01 MED ORDER — ALPRAZOLAM 0.25 MG PO TABS
0.2500 mg | ORAL_TABLET | Freq: Every evening | ORAL | Status: DC | PRN
  Administered 2023-01-01: 0.25 mg via ORAL
  Filled 2023-01-01: qty 1

## 2023-01-01 MED ORDER — HYDRALAZINE HCL 20 MG/ML IJ SOLN: 10.0000 mg | INTRAMUSCULAR | Status: DC | PRN

## 2023-01-01 MED ORDER — ENOXAPARIN SODIUM 40 MG/0.4ML IJ SOSY
40.0000 mg | PREFILLED_SYRINGE | INTRAMUSCULAR | Status: DC
  Administered 2023-01-01 – 2023-01-02 (×2): 40 mg via SUBCUTANEOUS
  Filled 2023-01-01 (×2): qty 0.4

## 2023-01-01 MED ORDER — HYDRALAZINE HCL 25 MG PO TABS
50.0000 mg | ORAL_TABLET | Freq: Three times a day (TID) | ORAL | Status: DC
  Administered 2023-01-01 – 2023-01-02 (×4): 50 mg via ORAL
  Filled 2023-01-01 (×4): qty 2

## 2023-01-01 MED ORDER — LORAZEPAM 2 MG/ML IJ SOLN
0.5000 mg | Freq: Once | INTRAMUSCULAR | Status: AC
  Administered 2023-01-01: 0.5 mg via INTRAVENOUS
  Filled 2023-01-01: qty 1

## 2023-01-01 MED ORDER — POTASSIUM CHLORIDE CRYS ER 20 MEQ PO TBCR
40.0000 meq | EXTENDED_RELEASE_TABLET | Freq: Two times a day (BID) | ORAL | Status: AC
  Administered 2023-01-01 (×2): 40 meq via ORAL
  Filled 2023-01-01 (×2): qty 2

## 2023-01-01 MED ORDER — ENSURE ENLIVE PO LIQD
237.0000 mL | Freq: Two times a day (BID) | ORAL | Status: DC
  Administered 2023-01-01 – 2023-01-02 (×3): 237 mL via ORAL
  Filled 2023-01-01 (×3): qty 237

## 2023-01-01 MED ORDER — SODIUM CHLORIDE 0.9 % IV BOLUS
500.0000 mL | Freq: Once | INTRAVENOUS | Status: AC
  Administered 2023-01-01: 500 mL via INTRAVENOUS

## 2023-01-01 MED ORDER — SODIUM CHLORIDE 0.9 % IV SOLN
12.5000 mg | Freq: Four times a day (QID) | INTRAVENOUS | Status: DC | PRN
  Filled 2023-01-01: qty 0.5

## 2023-01-01 MED ORDER — GADOBUTROL 1 MMOL/ML IV SOLN
5.0000 mL | Freq: Once | INTRAVENOUS | Status: AC | PRN
  Administered 2023-01-01: 5 mL via INTRAVENOUS

## 2023-01-01 MED ORDER — CARVEDILOL 3.125 MG PO TABS
3.1250 mg | ORAL_TABLET | Freq: Two times a day (BID) | ORAL | Status: DC
  Administered 2023-01-01 – 2023-01-02 (×3): 3.125 mg via ORAL
  Filled 2023-01-01 (×3): qty 1

## 2023-01-01 MED ORDER — ACETAMINOPHEN 325 MG PO TABS
650.0000 mg | ORAL_TABLET | Freq: Four times a day (QID) | ORAL | Status: DC | PRN
  Administered 2023-01-01: 650 mg via ORAL
  Filled 2023-01-01: qty 2

## 2023-01-01 MED ORDER — GABAPENTIN 100 MG PO CAPS
100.0000 mg | ORAL_CAPSULE | Freq: Two times a day (BID) | ORAL | Status: DC
  Administered 2023-01-01 – 2023-01-02 (×2): 100 mg via ORAL
  Filled 2023-01-01 (×2): qty 1

## 2023-01-01 MED ORDER — ACETAMINOPHEN 650 MG RE SUPP: 650.0000 mg | Freq: Four times a day (QID) | RECTAL | Status: DC | PRN

## 2023-01-01 MED ORDER — ONDANSETRON HCL 4 MG/2ML IJ SOLN
4.0000 mg | Freq: Four times a day (QID) | INTRAMUSCULAR | Status: DC | PRN
  Administered 2023-01-01: 4 mg via INTRAVENOUS
  Filled 2023-01-01: qty 2

## 2023-01-01 MED ORDER — LOSARTAN POTASSIUM 50 MG PO TABS
100.0000 mg | ORAL_TABLET | Freq: Every day | ORAL | Status: DC
  Administered 2023-01-01 – 2023-01-02 (×2): 100 mg via ORAL
  Filled 2023-01-01: qty 4
  Filled 2023-01-01: qty 2

## 2023-01-01 MED ORDER — LACTATED RINGERS IV SOLN: INTRAVENOUS | Status: AC

## 2023-01-01 MED ORDER — HYDRALAZINE HCL 20 MG/ML IJ SOLN
20.0000 mg | Freq: Once | INTRAMUSCULAR | Status: AC
  Administered 2023-01-01: 20 mg via INTRAVENOUS
  Filled 2023-01-01: qty 1

## 2023-01-01 MED ORDER — LORAZEPAM 0.5 MG PO TABS: 0.5000 mg | ORAL_TABLET | Freq: Once | ORAL | Status: DC

## 2023-01-01 NOTE — Progress Notes (Addendum)
Walked from stretcher to bed with max assist from doorway.  Stood on standing scale but very weak.  Granddaughter stated that this is new weakness. C/O feet burning.  Husband stated this happened another time and she received a B12 injection.  (Stated she did not think she took two xanax that she was out of xanax as this was an earlier concern. ) Patient alert and oriented x 3.  Lives with husband but granddaughter is getting more and more involved and is at house daily and will stay with  patient while in hosp. Received order for gabapentin for feet burning and xanax order for sleep at bedtime which patient takes at home

## 2023-01-01 NOTE — ED Triage Notes (Addendum)
PT BIB [facility] c/o weakness. Per EMS pt is normally ambulatory with a cane, has not been able to get off couch x1 day d/t weakness.  Husband thinks she may have taken too much of her Xanax.   Pt reports she has been urinating on herself tonight and that is not normal for her. States she "feels out of it"

## 2023-01-01 NOTE — Progress Notes (Signed)
Gabapentin seemed to help some with foot burning.  Had small incontinent urine and scan showed 111.  Husband at bedside

## 2023-01-01 NOTE — ED Notes (Signed)
ED TO INPATIENT HANDOFF REPORT  ED Nurse Name and Phone #: Wandra Mannan, Paramedic [phone]  S Name/Age/Gender [name] 82 y.o. female Room/Bed: APA12/APA12  Code Status   Code Status: Prior  Home/SNF/Other Home Patient oriented to: self, place, time, and situation Is this baseline? Yes   Triage Complete: Triage complete  Chief Complaint Acute metabolic encephalopathy [G93.41]  Triage Note PT BIB [facility] c/o weakness. Per EMS pt is normally ambulatory with a cane, has not been able to get off couch x1 day d/t weakness.  Husband thinks she may have taken too much of her Xanax.   Pt reports she has been urinating on herself tonight and that is not normal for her. States she "feels out of it"    Allergies No Known Allergies  Level of Care/Admitting Diagnosis ED Disposition     ED Disposition  Admit   Condition  --   Comment  Hospital Area: [facility] [[id number]]  Level of Care: Telemetry [5]  Covid Evaluation: Asymptomatic - no recent exposure (last 10 days) testing not required  Diagnosis: Acute metabolic encephalopathy [[id number]]  Admitting Physician: Erick Blinks [[id number]]  Attending Physician: Maurilio Lovely D [[id number]]          B Medical/Surgery History Past Medical History:  Diagnosis Date   History of kidney stones    Hypertension    Past Surgical History:  Procedure Laterality Date   CATARACT EXTRACTION W/PHACO Left 05/02/2019   Procedure: CATARACT EXTRACTION PHACO AND INTRAOCULAR LENS PLACEMENT (IOC);  Surgeon: Fabio Pierce, MD;  Location: [facility];  Service: Ophthalmology;  Laterality: Left;  CDE: 27.19   CATARACT EXTRACTION W/PHACO Right 05/16/2019   Procedure: CATARACT EXTRACTION PHACO AND INTRAOCULAR LENS PLACEMENT RIGHT EYE;  Surgeon: Fabio Pierce, MD;  Location: [facility];  Service: Ophthalmology;  Laterality: Right;  CDE: 18.89   CESAREAN SECTION       A IV Location/Drains/Wounds Patient Lines/Drains/Airways Status      Active Line/Drains/Airways     Name Placement date Placement time Site Days   Peripheral IV 01/01/23 20 G Left;Posterior Hand 01/01/23  0413  Hand  less than 1            Intake/Output Last 24 hours No intake or output data in the 24 hours ending 01/01/23 0808  Labs/Imaging Results for orders placed or performed during the hospital encounter of 01/01/23 (from the past 48 hour(s))  Urinalysis, Routine w reflex microscopic -Urine, Clean Catch     Status: Abnormal   Collection Time: 01/01/23  4:37 AM  Result Value Ref Range   Color, Urine YELLOW YELLOW   APPearance CLOUDY (A) CLEAR   Specific Gravity, Urine 1.006 1.005 - 1.030   pH 8.0 5.0 - 8.0   Glucose, UA 50 (A) NEGATIVE mg/dL   Hgb urine dipstick SMALL (A) NEGATIVE   Bilirubin Urine NEGATIVE NEGATIVE   Ketones, ur 5 (A) NEGATIVE mg/dL   Protein, ur 30 (A) NEGATIVE mg/dL   Nitrite NEGATIVE NEGATIVE   Leukocytes,Ua NEGATIVE NEGATIVE   RBC / HPF 0-5 0 - 5 RBC/hpf   WBC, UA 0-5 0 - 5 WBC/hpf   Bacteria, UA NONE SEEN NONE SEEN   Squamous Epithelial / HPF 0-5 0 - 5 /HPF   Budding Yeast PRESENT    Amorphous Crystal PRESENT     Comment: Performed at [facility], [address] [place], [place] [address]  Basic metabolic panel     Status: Abnormal   Collection Time: 01/01/23  5:49  AM  Result Value Ref Range   Sodium 138 135 - 145 mmol/L   Potassium 3.2 (L) 3.5 - 5.1 mmol/L   Chloride 101 98 - 111 mmol/L   CO2 29 22 - 32 mmol/L   Glucose, Bld 138 (H) 70 - 99 mg/dL    Comment: Glucose reference range applies only to samples taken after fasting for at least 8 hours.   BUN 10 8 - 23 mg/dL   Creatinine, Ser 1.61 0.44 - 1.00 mg/dL   Calcium 9.4 8.9 - 09.6 mg/dL   GFR, Estimated >04 >54 mL/min    Comment: (NOTE) Calculated using the CKD-EPI Creatinine Equation (2021)    Anion gap 8 5 - 15    Comment: Performed at [facility], [address], [place], [place] [address]  CBC     Status: None   Collection Time: 01/01/23  5:49  AM  Result Value Ref Range   WBC 8.4 4.0 - 10.5 K/uL   RBC 4.59 3.87 - 5.11 MIL/uL   Hemoglobin 13.3 12.0 - 15.0 g/dL   HCT 91.4 78.2 - 95.6 %   MCV 91.3 80.0 - 100.0 fL   MCH 29.0 26.0 - 34.0 pg   MCHC 31.7 30.0 - 36.0 g/dL   RDW 21.3 08.6 - 57.8 %   Platelets 177 150 - 400 K/uL   nRBC 0.0 0.0 - 0.2 %    Comment: Performed at [facility], [address] [place], [place] [address]  Troponin I (High Sensitivity)     Status: None   Collection Time: 01/01/23  5:50 AM  Result Value Ref Range   Troponin I (High Sensitivity) 13 <18 ng/L    Comment: (NOTE) Elevated high sensitivity troponin I (hsTnI) values and significant  changes across serial measurements may suggest ACS but many other  chronic and acute conditions are known to elevate hsTnI results.  Refer to the "Links" section for chest pain algorithms and additional  guidance. Performed at [facility], [address] [place], [place] [address]   Rapid urine drug screen (hospital performed)     Status: None   Collection Time: 01/01/23  6:05 AM  Result Value Ref Range   Opiates NONE DETECTED NONE DETECTED   Cocaine NONE DETECTED NONE DETECTED   Benzodiazepines NONE DETECTED NONE DETECTED   Amphetamines NONE DETECTED NONE DETECTED   Tetrahydrocannabinol NONE DETECTED NONE DETECTED   Barbiturates NONE DETECTED NONE DETECTED    Comment: (NOTE) DRUG SCREEN FOR MEDICAL PURPOSES ONLY.  IF CONFIRMATION IS NEEDED FOR ANY PURPOSE, NOTIFY LAB WITHIN 5 DAYS.  LOWEST DETECTABLE LIMITS FOR URINE DRUG SCREEN Drug Class                     Cutoff (ng/mL) Amphetamine and metabolites    1000 Barbiturate and metabolites    200 Benzodiazepine                 200 Opiates and metabolites        300 Cocaine and metabolites        300 THC                            50 Performed at [facility], [address], [place], [place] [address]   Culture, blood (single)     Status: None (Preliminary result)   Collection Time: 01/01/23  6:45 AM    Specimen: BLOOD  Result Value Ref Range   Specimen  Description BLOOD RIGHT ANTECUBITAL    Special Requests      BOTTLES DRAWN AEROBIC AND ANAEROBIC Blood Culture adequate volume Performed at [facility], [address] [place], [place] [address]    Culture PENDING    Report Status PENDING    CT HEAD WO CONTRAST ( )  Result Date: 01/01/2023 CLINICAL DATA:  82 year old female with weakness, newly unable to ambulate, delirium. EXAM: CT HEAD WITHOUT CONTRAST TECHNIQUE: Contiguous axial images were obtained from the base of the skull through the vertex without intravenous contrast. RADIATION DOSE REDUCTION: This exam was performed according to the departmental dose-optimization program which includes automated exposure control, adjustment of the mA and/or kV according to patient size and/or use of iterative reconstruction technique. COMPARISON:  Head CT 12/26/2022. FINDINGS: Brain: Chronic small vessel disease with confluent bilateral cerebral white matter hypodensity, chronic heterogeneity in the deep gray nuclei especially the right thalamus. Stable cerebral volume. No midline shift, ventriculomegaly, mass effect, evidence of mass lesion, intracranial hemorrhage or evidence of cortically based acute infarction. Vascular: Calcified atherosclerosis at the skull base. No suspicious intracranial vascular hyperdensity. Skull: No acute osseous abnormality identified. Sinuses/Orbits: Visualized paranasal sinuses and mastoids are stable and well aerated. Other: No acute orbit or scalp soft tissue finding. IMPRESSION: No acute intracranial abnormality. 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 Electronically Signed   By: Odessa Fleming M.D.   On: 01/01/2023 06:37    Pending Labs Unresulted Labs (From admission, onward)    None       Vitals/Pain Today's Vitals   01/01/23 0515 01/01/23 0530 01/01/23 0545 01/01/23 0600  BP: (!) 181/97 (!) 188/93 (!) 194/86 (!) 193/91  Pulse: 68 70 70  67  Resp: 20 18 18 20   Temp:      TempSrc:      SpO2: 90% 92% 93% 92%  Weight:      Height:      PainSc:        Isolation Precautions No active isolations  Medications Medications  sodium chloride 0.9 % bolus 500 mL (0 mLs Intravenous Stopped 01/01/23 0708)  hydrALAZINE (APRESOLINE) injection 20 mg (20 mg Intravenous Given 01/01/23 0716)    Mobility walks     Focused Assessments Pulmonary Assessment Handoff:  Lung sounds:   O2 Device: Room Air      R Recommendations: See Admitting Provider Note  Report given to:   Additional Notes: N/A

## 2023-01-01 NOTE — ED Notes (Signed)
Pt returned from CT °

## 2023-01-01 NOTE — ED Provider Notes (Signed)
[facility] Emergency Department Provider Note MRN:  [id number]  Arrival date & time: 01/01/23     Chief Complaint   Weakness   History of Present Illness   [name] is a 82 y.o. year-old female with a history of hypertension presenting to the ED with chief complaint of weakness.  Global weakness and malaise for the past day or so.  Family thinks she took too much Xanax.  She feels very out of it, had some urinary incontinence this evening.  Review of Systems  A thorough review of systems was obtained and all systems are negative except as noted in the HPI and PMH.   Patient's Health History    Past Medical History:  Diagnosis Date   History of kidney stones    Hypertension     Past Surgical History:  Procedure Laterality Date   CATARACT EXTRACTION W/PHACO Left 05/02/2019   Procedure: CATARACT EXTRACTION PHACO AND INTRAOCULAR LENS PLACEMENT (IOC);  Surgeon: Fabio Pierce, MD;  Location: [facility];  Service: Ophthalmology;  Laterality: Left;  CDE: 27.19   CATARACT EXTRACTION W/PHACO Right 05/16/2019   Procedure: CATARACT EXTRACTION PHACO AND INTRAOCULAR LENS PLACEMENT RIGHT EYE;  Surgeon: Fabio Pierce, MD;  Location: [facility];  Service: Ophthalmology;  Laterality: Right;  CDE: 18.89   CESAREAN SECTION      No family history on file.  Social History   Socioeconomic History   Marital status: Married    Spouse name: Not on file   Number of children: Not on file   Years of education: Not on file   Highest education level: Not on file  Occupational History   Not on file  Tobacco Use   Smoking status: Former    Passive exposure: Past   Smokeless tobacco: Never  Vaping Use   Vaping Use: Not on file  Substance and Sexual Activity   Alcohol use: No   Drug use: Never   Sexual activity: Not on file  Other Topics Concern   Not on file  Social History Narrative   Not on file   Social Determinants of Health   Financial Resource Strain: Not  on file  Food Insecurity: Not on file  Transportation Needs: Not on file  Physical Activity: Not on file  Stress: Not on file  Social Connections: Not on file  Intimate Partner Violence: Not on file     Physical Exam   Vitals:   01/01/23 0545 01/01/23 0600  BP: (!) 194/86 (!) 193/91  Pulse: 70 67  Resp: 18 20  Temp:    SpO2: 93% 92%    CONSTITUTIONAL: Chronically ill-appearing, NAD NEURO/PSYCH:  Alert and oriented x 3, slow to respond to questions, mildly globally confused, moves all extremities equally EYES:  eyes equal and reactive ENT/NECK:  no LAD, no JVD CARDIO: Regular rate, well-perfused, normal S1 and S2 PULM:  CTAB no wheezing or rhonchi GI/GU:  non-distended, non-tender MSK/SPINE:  No gross deformities, no edema SKIN:  no rash, atraumatic   *Additional and/or pertinent findings included in MDM below  Diagnostic and Interventional Summary    EKG Interpretation  Date/Time:  Thursday January 01 2023 04:31:17 EDT Ventricular Rate:  80 PR Interval:  188 QRS Duration: 127 QT Interval:  393 QTC Calculation: 454 R Axis:   85 Text Interpretation: Sinus rhythm Atrial premature complex Probable left atrial enlargement Right bundle branch block Confirmed by Kennis Carina [phone]) on 01/01/2023 6:27:00 AM       Labs Reviewed  BASIC  METABOLIC PANEL - Abnormal; Notable for the following components:      Result Value   Potassium 3.2 (*)    Glucose, Bld 138 (*)    All other components within normal limits  CULTURE, BLOOD (SINGLE)  CBC  URINALYSIS, ROUTINE W REFLEX MICROSCOPIC  RAPID URINE DRUG SCREEN, HOSP PERFORMED  TROPONIN I (HIGH SENSITIVITY)    CT HEAD WO CONTRAST ( )  Final Result      Medications  hydrALAZINE (APRESOLINE) injection 20 mg (has no administration in time range)  sodium chloride 0.9 % bolus 500 mL (500 mLs Intravenous New Bag/Given 01/01/23 7829)     Procedures  /  Critical Care Procedures  ED Course and Medical Decision Making   Initial Impression and Ddx Differential diagnosis includes UTI, intracranial bleeding or mass, electrolyte disturbance, dehydration, underlying sepsis.  Past medical/surgical history that increases complexity of ED encounter: Hypertension  Interpretation of Diagnostics I personally reviewed the EKG and my interpretation is as follows: Sinus rhythm  Labs overall reassuring with no significant blood count or electrolyte disturbance  Patient Reassessment and Ultimate Disposition/Management     Awaiting urinalysis.  Anticipating admission for either UTI or hypertensive urgency given patient's report of confusion.  Suppose that is possible that this could be polypharmacy and patient has a significant recovery back to baseline.  Signed out to oncoming provider at shift change.  Patient management required discussion with the following services or consulting groups:  None  Complexity of Problems Addressed Acute illness or injury that poses threat of life of bodily function  Additional Data Reviewed and Analyzed Further history obtained from: Further history from spouse/family member  Additional Factors Impacting ED Encounter Risk Consideration of hospitalization  Elmer Sow. Pilar Plate, MD [facility] Emergency Medicine [facility] [email or web address]  Final Clinical Impressions(s) / ED Diagnoses     ICD-10-CM   1. Weakness  R53.1       ED Discharge Orders     None        Discharge Instructions Discussed with and Provided to Patient:   Discharge Instructions   None      Sabas Sous, MD 01/01/23 [phone]

## 2023-01-01 NOTE — H&P (Signed)
History and Physical    [name] ZOX:[id number] DOB: 24-Dec-1940 DOA: 01/01/2023  PCP: Elfredia Nevins, MD   Patient coming from: Home  Chief Complaint: Weakness/malaise/confusion  HPI: [name] is a 82 y.o. female with medical history significant for hypertension and recurrent kidney stones who presented to the ED due to complaints of weakness and malaise as well as decreased appetite and persistent nausea with dry heaving.  She is also noted to have some confusion per son at bedside.  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  She has followed up with GI on 6/11 with plans to have MRI.  She continues to have some nausea with dry heaving and has not taken her home blood pressure medications in the last several days.  She denies any diarrhea, fevers, or chills.  She has notably had a 20 pound weight loss that was unintentional over the past year and also has some trouble with constipation.   ED Course: Vital signs with elevated blood pressure readings and she has received some IV hydralazine for this.  Potassium 3.2.  CT head with no acute findings and EKG with sinus rhythm at 80 bpm.  Review of Systems: Reviewed as noted above, otherwise negative.  Past Medical History:  Diagnosis Date   History of kidney stones    Hypertension     Past Surgical History:  Procedure Laterality Date   CATARACT EXTRACTION W/PHACO Left 05/02/2019   Procedure: CATARACT EXTRACTION PHACO AND INTRAOCULAR LENS PLACEMENT (IOC);  Surgeon: Fabio Pierce, MD;  Location: [facility];  Service: Ophthalmology;  Laterality: Left;  CDE: 27.19   CATARACT EXTRACTION W/PHACO Right 05/16/2019   Procedure: CATARACT EXTRACTION PHACO AND INTRAOCULAR LENS PLACEMENT RIGHT EYE;  Surgeon: Fabio Pierce, MD;  Location: [facility];  Service: Ophthalmology;   Laterality: Right;  CDE: 18.89   CESAREAN SECTION       reports that she has quit smoking. She has been exposed to tobacco smoke. She has never used smokeless tobacco. She reports that she does not drink alcohol and does not use drugs.  No Known Allergies  No family history on file.  Prior to Admission medications   Medication Sig Start Date End Date Taking? Authorizing Provider  acetaminophen (TYLENOL) 500 MG tablet Take 1,000 mg by mouth daily as needed for mild pain.   Yes [provider]  ALPRAZolam (XANAX) 0.25 MG tablet Take 0.25 mg by mouth. One daily    [provider]  atorvastatin (LIPITOR) 10 MG tablet Take 10 mg by mouth daily.     [provider]  carvedilol (COREG) 3.125 MG tablet Take 3.125 mg by mouth 2 (two) times daily with a meal.    [provider]  Cyanocobalamin (B-12 COMPLIANCE INJECTION IJ) Inject as directed. One monthly    [provider]  feeding supplement, ENSURE ENLIVE, (ENSURE ENLIVE) LIQD Take 237 mLs by mouth 2 (two) times daily between meals. Patient not taking: Reported on 12/30/2022 04/16/20   Shon Hale, MD  guaiFENesin-dextromethorphan (ROBITUSSIN DM) 100-10 MG/5ML syrup Take 10 mLs by mouth every 4 (four) hours as needed for cough. 04/16/20   Shon Hale, MD  hydrALAZINE (APRESOLINE) 50 MG tablet Take 50 mg by mouth 3 (three) times daily. 03/03/19   [provider]  LORazepam (ATIVAN) 0.5 MG tablet Take 1/2  tablet 1 hour prior to MRI, can take second half 30 minutes prior if anxiety still present 12/30/22   Carlan, Chelsea L, NP  losartan (COZAAR) 100 MG tablet Take 1 tablet (100 mg total) by mouth daily. 04/16/20   Shon Hale, MD  ondansetron (ZOFRAN-ODT) 8 MG disintegrating tablet Take 8 mg by mouth every 8 (eight) hours as needed. 12/18/22   [provider]  zinc sulfate 220 (50 Zn) MG capsule Take 1 capsule (220 mg total) by mouth daily. 04/17/20   Shon Hale, MD     Physical Exam: Vitals:   01/01/23 0900 01/01/23 0930 01/01/23 0959 01/01/23 1030  BP: (!) 167/80 (!) 166/98  137/71  Pulse: 74 78  82  Resp: 18 18  (!) 22  Temp:   (!) 97.5 F (36.4 C)   TempSrc:   Oral   SpO2: 95% 94%  91%  Weight:      Height:        Constitutional: NAD, calm, comfortable Vitals:   01/01/23 0900 01/01/23 0930 01/01/23 0959 01/01/23 1030  BP: (!) 167/80 (!) 166/98  137/71  Pulse: 74 78  82  Resp: 18 18  (!) 22  Temp:   (!) 97.5 F (36.4 C)   TempSrc:   Oral   SpO2: 95% 94%  91%  Weight:      Height:       Eyes: lids and conjunctivae normal Neck: normal, supple Respiratory: clear to auscultation bilaterally. Normal respiratory effort. No accessory muscle use.  Cardiovascular: Regular rate and rhythm, no murmurs. Abdomen: no tenderness, no distention. Bowel sounds positive.  Musculoskeletal:  No edema. Skin: no rashes, lesions, ulcers.  Psychiatric: Flat affect  Labs on Admission: I have personally reviewed following labs and imaging studies  CBC: Recent Labs  Lab 12/26/22 1203 01/01/23 0549  WBC 5.3 8.4  NEUTROABS 3.9  --   HGB 13.0 13.3  HCT 40.6 41.9  MCV 91.2 91.3  PLT 190 177   Basic Metabolic Panel: Recent Labs  Lab 12/26/22 1203 01/01/23 0549  NA 141 138  K 4.0 3.2*  CL 103 101  CO2 29 29  GLUCOSE 112* 138*  BUN 15 10  CREATININE 0.99 0.89  CALCIUM 10.4* 9.4  MG 1.9  --    GFR: Estimated Creatinine Clearance: 43.6 mL/min (by C-G formula based on SCr of 0.89 mg/dL). Liver Function Tests: Recent Labs  Lab 12/26/22 1203  AST 24  ALT 29  ALKPHOS 52  BILITOT 1.7*  PROT 6.6  ALBUMIN 4.1   Recent Labs  Lab 12/26/22 1203  LIPASE 48   No results for input(s): "AMMONIA" in the last 168 hours. Coagulation Profile: No results for input(s): "INR", "PROTIME" in the last 168 hours. Cardiac Enzymes: No results for input(s): "CKTOTAL", "CKMB", "CKMBINDEX", "TROPONINI" in the last 168 hours. BNP (last 3 results) No  results for input(s): "PROBNP" in the last 8760 hours. HbA1C: No results for input(s): "HGBA1C" in the last 72 hours. CBG: No results for input(s): "GLUCAP" in the last 168 hours. Lipid Profile: No results for input(s): "CHOL", "HDL", "LDLCALC", "TRIG", "CHOLHDL", "LDLDIRECT" in the last 72 hours. Thyroid Function Tests: No results for input(s): "TSH", "T4TOTAL", "FREET4", "T3FREE", "THYROIDAB" in the last 72 hours. Anemia Panel: No results for input(s): "VITAMINB12", "FOLATE", "FERRITIN", "TIBC", "IRON", "RETICCTPCT" in the last 72 hours. Urine analysis:    Component Value Date/Time   COLORURINE YELLOW 01/01/2023 0437   APPEARANCEUR CLOUDY (A) 01/01/2023 0437   LABSPEC 1.006 01/01/2023 4401  PHURINE 8.0 01/01/2023 0437   GLUCOSEU 50 (A) 01/01/2023 0437   HGBUR SMALL (A) 01/01/2023 0437   BILIRUBINUR NEGATIVE 01/01/2023 0437   KETONESUR 5 (A) 01/01/2023 0437   PROTEINUR 30 (A) 01/01/2023 0437   UROBILINOGEN 0.2 04/14/2008 1005   NITRITE NEGATIVE 01/01/2023 0437   LEUKOCYTESUR NEGATIVE 01/01/2023 0437    Radiological Exams on Admission: CT HEAD WO CONTRAST ( )  Result Date: 01/01/2023 CLINICAL DATA:  82 year old female with weakness, newly unable to ambulate, delirium. EXAM: CT HEAD WITHOUT CONTRAST TECHNIQUE: Contiguous axial images were obtained from the base of the skull through the vertex without intravenous contrast. RADIATION DOSE REDUCTION: This exam was performed according to the departmental dose-optimization program which includes automated exposure control, adjustment of the mA and/or kV according to patient size and/or use of iterative reconstruction technique. COMPARISON:  Head CT 12/26/2022. FINDINGS: Brain: Chronic small vessel disease with confluent bilateral cerebral white matter hypodensity, chronic heterogeneity in the deep gray nuclei especially the right thalamus. Stable cerebral volume. No midline shift, ventriculomegaly, mass effect, evidence of mass lesion,  intracranial hemorrhage or evidence of cortically based acute infarction. Vascular: Calcified atherosclerosis at the skull base. No suspicious intracranial vascular hyperdensity. Skull: No acute osseous abnormality identified. Sinuses/Orbits: Visualized paranasal sinuses and mastoids are stable and well aerated. Other: No acute orbit or scalp soft tissue finding. IMPRESSION: No acute intracranial abnormality. 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 Electronically Signed   By: Odessa Fleming M.D.   On: 01/01/2023 06:37    EKG: Independently reviewed.  SR 80 bpm.  Assessment/Plan Principal Problem:   Acute metabolic encephalopathy Active Problems:   Generalized weakness   Essential hypertension   Splenic lesion   Pancreatic lesion   Constipation   Loss of weight   Nausea without vomiting    Acute metabolic encephalopathy likely related to hypertensive crisis -IV hydralazine as needed for blood pressure control which appears to be helping -Resume home oral medications -CT head with no acute findings  Hypertensive crisis secondary to medication noncompliance -Continue blood pressure management as above  Intractable nausea and vomiting -Masses noted on CT scan with MRI pending as below -Zofran as needed for nausea or vomiting -Clear liquid diet and advance as tolerated -IV fluid  Mild hypokalemia -Replete and reevaluate  Abdominal mass -Being followed by GI outpatient with MRI ordered, but will obtain inpatient given current symptoms, premedicate with Ativan -No need for further inpatient evaluation  Anxiety disorder -Takes Xanax as needed   DVT prophylaxis: Lovenox Code Status: Full Family Communication: Son, [name] at bedside Disposition Plan: Admit for hypertension management Consults called: None Admission status: Observation, telemetry  Severity of Illness: The appropriate patient status for this patient is OBSERVATION. Observation status is  judged to be reasonable and necessary in order to provide the required intensity of service to ensure the patient's safety. The patient's presenting symptoms, physical exam findings, and initial radiographic and laboratory data in the context of their medical condition is felt to place them at decreased risk for further clinical deterioration. Furthermore, it is anticipated that the patient will be medically stable for discharge from the hospital within 2 midnights of admission.    [name] D Sherryll Burger DO [facility]  If 7PM-7AM, please contact night-coverage [email or web address]  01/01/2023, 11:22 AM

## 2023-01-01 NOTE — Progress Notes (Signed)
Transition of Care [facility]) - Inpatient Brief Assessment   Patient Details  Name: [name] MRN: [id number] Date of Birth: 27-Jun-1941  Transition of [facility]) CM/SW Contact:    Annice Needy, LCSW Phone Number: 01/01/2023, 3:51 PM   Clinical Narrative: Transition of Care Department [facility]) has reviewed patient and no TOC needs have been identified at this time. We will continue to monitor patient advancement through interdisciplinary progression rounds. If new patient transition needs arise, please place a TOC consult.   Transition of Care Asessment: Insurance and Status: Insurance coverage has been reviewed Patient has primary care physician: Yes Home environment has been reviewed: from home with spouse Prior level of function:: granddaughter has been providing increased assistance recently Prior/Current Home Services: No current home services Social Determinants of Health Reivew: SDOH reviewed no interventions necessary Readmission risk has been reviewed: Yes Transition of care needs: no transition of care needs at this time

## 2023-01-02 DIAGNOSIS — G9341 Metabolic encephalopathy: Secondary | ICD-10-CM | POA: Diagnosis not present

## 2023-01-02 LAB — COMPREHENSIVE METABOLIC PANEL
ALT: 18 U/L (ref 0–44)
AST: 16 U/L (ref 15–41)
Albumin: 3.4 g/dL — ABNORMAL LOW (ref 3.5–5.0)
Alkaline Phosphatase: 45 U/L (ref 38–126)
Anion gap: 6 (ref 5–15)
BUN: 13 mg/dL (ref 8–23)
CO2: 27 mmol/L (ref 22–32)
Calcium: 9.4 mg/dL (ref 8.9–10.3)
Chloride: 106 mmol/L (ref 98–111)
Creatinine, Ser: 0.85 mg/dL (ref 0.44–1.00)
GFR, Estimated: >60 mL/min (ref >60)
Glucose, Bld: 99 mg/dL (ref 70–99)
Potassium: 3.6 mmol/L (ref 3.5–5.1)
Sodium: 139 mmol/L (ref 135–145)
Total Bilirubin: 1.5 mg/dL — ABNORMAL HIGH (ref 0.3–1.2)
Total Protein: 5.5 g/dL — ABNORMAL LOW (ref 6.5–8.1)

## 2023-01-02 LAB — CULTURE, BLOOD (SINGLE)
Culture: NO GROWTH
Special Requests: ADEQUATE

## 2023-01-02 LAB — CBC
HCT: 40.5 % (ref 36.0–46.0)
Hemoglobin: 12.9 g/dL (ref 12.0–15.0)
MCH: 29.4 pg (ref 26.0–34.0)
MCHC: 31.9 g/dL (ref 30.0–36.0)
MCV: 92.3 fL (ref 80.0–100.0)
Platelets: 170 10*3/uL (ref 150–400)
RBC: 4.39 MIL/uL (ref 3.87–5.11)
RDW: 13.9 % (ref 11.5–15.5)
WBC: 7.7 10*3/uL (ref 4.0–10.5)
nRBC: 0 % (ref 0.0–0.2)

## 2023-01-02 LAB — MAGNESIUM: Magnesium: 1.9 mg/dL (ref 1.7–2.4)

## 2023-01-02 MED ORDER — HALOPERIDOL LACTATE 5 MG/ML IJ SOLN
2.0000 mg | Freq: Once | INTRAMUSCULAR | Status: AC
  Administered 2023-01-02: 2 mg via INTRAVENOUS
  Filled 2023-01-02: qty 1

## 2023-01-02 MED ORDER — ONDANSETRON HCL 4 MG PO TABS: 4.0000 mg | ORAL_TABLET | Freq: Every day | ORAL | 0 refills | Status: AC | PRN

## 2023-01-02 NOTE — Discharge Summary (Signed)
Physician Discharge Summary  [name] ZOX:[id number] DOB: 1941-04-06 DOA: 01/01/2023  PCP: Elfredia Nevins, MD  Admit date: 01/01/2023  Discharge date: 01/02/2023  Admitted From:Home  Disposition:  Home  Recommendations for Outpatient Follow-up:  Follow up with PCP in 1-2 weeks Follow-up with GI as previously scheduled Zofran prescribed as needed for nausea or vomiting Continue other home medications as prior  Home Health: None  Equipment/Devices: None  Discharge Condition:Stable  CODE STATUS: Full  Diet recommendation: Heart Healthy  Brief/Interim Summary:  [name] is a 82 y.o. female with medical history significant for hypertension and recurrent kidney stones who presented to the ED due to complaints of weakness and malaise as well as decreased appetite and persistent nausea with dry heaving.  She is also noted to have some confusion per son at bedside.  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  She was admitted with concern for acute encephalopathy in the setting of hypertensive crisis with poor medication compliance.  She underwent abdominal MRI while hospitalized with benign lesions noted and recommendations to follow-up MRI in approximately 2 years to reassess.  She will need to follow-up with GI outpatient and follow-up for colonoscopy.  Zofran prescribed as needed for nausea or vomiting.  She is tolerating diet and ambulating well without any further weakness and is eager for discharge today.  No other acute events or concerns noted.  Discharge Diagnoses:  Principal Problem:   Acute metabolic encephalopathy Active Problems:   Generalized weakness   Essential hypertension   Splenic lesion   Pancreatic lesion   Constipation   Loss of weight   Nausea without vomiting  Principal  discharge diagnosis: Acute metabolic encephalopathy likely related to hypertensive crisis.  Intractable nausea and vomiting.  Discharge Instructions  Discharge Instructions     Diet - low sodium heart healthy   Complete by: As directed    Increase activity slowly   Complete by: As directed       Allergies as of 01/02/2023   No Known Allergies      Medication List     TAKE these medications    acetaminophen 500 MG tablet Commonly known as: TYLENOL Take 1,000 mg by mouth daily as needed for mild pain.   ALPRAZolam 0.25 MG tablet Commonly known as: XANAX Take 0.25 mg by mouth daily as needed for anxiety.   atorvastatin 10 MG tablet Commonly known as: LIPITOR Take 10 mg by mouth daily.   B-12 COMPLIANCE INJECTION IJ Inject as directed. One monthly   carvedilol 3.125 MG tablet Commonly known as: COREG Take 3.125 mg by mouth 2 (two) times daily with a meal.   feeding supplement Liqd Take 237 mLs by mouth 2 (two) times daily between meals.   guaiFENesin-dextromethorphan 100-10 MG/5ML syrup Commonly known as: ROBITUSSIN DM Take 10 mLs by mouth every 4 (four) hours as needed for cough.   hydrALAZINE 50 MG tablet Commonly known as: APRESOLINE Take 50 mg by mouth 3 (three) times daily.   LORazepam 0.5 MG tablet Commonly known as: Ativan Take 1/2 tablet 1 hour prior to MRI, can take second half 30 minutes prior if anxiety still present   losartan 100 MG tablet Commonly known as: COZAAR Take 1 tablet (100 mg total) by mouth daily.   ondansetron 4 MG tablet Commonly known as:  Zofran Take 1 tablet (4 mg total) by mouth daily as needed for nausea or vomiting.   ondansetron 8 MG disintegrating tablet Commonly known as: ZOFRAN-ODT Take 8 mg by mouth every 8 (eight) hours as needed for nausea.   zinc sulfate 220 (50 Zn) MG capsule Take 1 capsule (220 mg total) by mouth daily.        Follow-up Information     Elfredia Nevins, MD. Schedule an appointment as soon  as possible for a visit in 1 week(s).   Specialty: Internal Medicine Contact information: [address] [place] [address] [phone]                No Known Allergies  Consultations: None   Procedures/Studies: MR ABDOMEN W WO CONTRAST  Result Date: 01/01/2023 CLINICAL DATA:  82 year old female history of abdominal mass. Follow-up study. EXAM: MRI ABDOMEN WITHOUT AND WITH CONTRAST (INCLUDING MRCP) TECHNIQUE: Multiplanar multisequence MR imaging of the abdomen was performed both before and after the administration of intravenous contrast. Heavily T2-weighted images of the biliary and pancreatic ducts were obtained, and three-dimensional MRCP images were rendered by post processing. CONTRAST:  5mL GADAVIST GADOBUTROL 1 MMOL/ML IV SOLN COMPARISON:  CT of the abdomen and pelvis 12/26/2022. No prior abdominal MRI. FINDINGS: Comment: Today's study is limited by extensive patient respiratory motion. Lower chest: Unremarkable. Hepatobiliary: No suspicious cystic or solid hepatic lesions. Gallbladder is only mildly distended. In the fundus of the gallbladder there is a dependent 1.4 cm filling defect, compatible with a gallstone. No gallbladder wall thickening, pericholecystic fluid or surrounding inflammatory changes. No intra or extrahepatic biliary ductal dilatation noted on MRCP images. Common bile duct measures 6 mm in the porta hepatis. Pancreas: 5 mm T1 hypointense, T2 hyperintense nonenhancing lesion in the head of the pancreas (axial image 22 of series 4), does not appear to communicate with the main pancreatic duct. No main pancreatic ductal dilatation. No other suspicious solid enhancing pancreatic neoplasm confidently identified. No peripancreatic fluid collections or inflammatory changes. Spleen: There is a round area in the medial aspect of the spleen estimated to measure approximately 3.0 x 2.9 cm (axial image 14 of series 15) which is isointense to spleen on T1 and T2  weighted images, and demonstrates avid arterial phase hyperenhancement with less intense signal intensity on delayed images which is similar to surrounding splenic parenchyma, most compatible with a benign splenic lesion, presumably a splenic hamartoma. Adrenals/Urinary Tract: Multiple T1 hypointense, T2 hyperintense, nonenhancing lesions are noted in the kidneys bilaterally, the majority of which are simple in appearance and compatible with simple (Bosniak class 1) cysts which require no imaging follow-up. The exception to this is a lesion in the interpolar region of the left kidney (axial image 15 of series 4 and coronal image 17 of series 3) estimated to measure approximately 5.1 x 5.0 x 3.6 cm, which has multiple (greater than 4) thin internal septations, but no definite enhancing internal soft tissue or mural nodularity, compatible with a Bosniak class 68F cyst. No other definite suspicious appearing renal lesions. No hydroureteronephrosis in the visualized portions of the abdomen. Bilateral adrenal glands are normal in appearance. Stomach/Bowel: Loops of small bowel in the right-side of the abdomen and central pelvis which appears thickened and mildly dilated measuring up to 3.5 cm in diameter. Several colonic diverticuli are noted. Vascular/Lymphatic: No aneurysm identified in the visualized abdominal vasculature. No lymphadenopathy noted in the abdomen. Other: Trace amount of fluid adjacent to thickened small bowel loops in the right-side of  the abdomen. New fluid in the left pericolic gutter. No significant volume of ascites noted in the visualized portions of the peritoneal cavity. Musculoskeletal: No aggressive appearing lytic or blastic lesions are noted in the visualized portions of the skeleton. IMPRESSION: 1. 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 No imaging follow-up is recommended. 2. 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 Given the patient's advanced age, repeat abdominal MRI with and without IV gadolinium with MRCP in 2 years is recommended to ensure the stability of this finding. 3. Bosniak class 25F cyst in the interpolar region of the left kidney. Repeat abdominal MRI with and without IV gadolinium is recommended in 6 months to ensure the stability of this finding. 4. Interval development of dilated loops of small bowel which appears thickened, in addition to new small volume of ascites adjacent to the thickened small bowel loops and in the left pericolic gutter. Findings are poorly evaluated by MRI. Repeat contrast-enhanced CT of the abdomen and pelvis should be considered, particularly if there is any clinical concern for bowel obstruction or enteritis. 5. Cholelithiasis without evidence of acute cholecystitis. 6. Colonic diverticulosis. Electronically Signed   By: Trudie Reed M.D.   On: 01/01/2023 15:09   MR 3D Recon At Scanner  Result Date: 01/01/2023 CLINICAL DATA:  82 year old female history of abdominal mass. Follow-up study. EXAM: MRI ABDOMEN WITHOUT AND WITH CONTRAST (INCLUDING MRCP) TECHNIQUE: Multiplanar multisequence MR imaging of the abdomen was performed both before and after the administration of intravenous contrast. Heavily T2-weighted images of the biliary and pancreatic ducts were obtained, and three-dimensional MRCP images were rendered by post processing. CONTRAST:  5mL GADAVIST GADOBUTROL 1 MMOL/ML IV SOLN COMPARISON:  CT of the abdomen and pelvis 12/26/2022. No prior abdominal MRI. FINDINGS: Comment: Today's study is limited by extensive patient respiratory motion. Lower chest: Unremarkable. Hepatobiliary: No suspicious cystic or solid hepatic lesions. Gallbladder is only mildly distended. In the fundus of the gallbladder there is a dependent 1.4 cm filling defect,  compatible with a gallstone. No gallbladder wall thickening, pericholecystic fluid or surrounding inflammatory changes. No intra or extrahepatic biliary ductal dilatation noted on MRCP images. Common bile duct measures 6 mm in the porta hepatis. Pancreas: 5 mm T1 hypointense, T2 hyperintense nonenhancing lesion in the head of the pancreas (axial image 22 of series 4), does not appear to communicate with the main pancreatic duct. No main pancreatic ductal dilatation. No other suspicious solid enhancing pancreatic neoplasm confidently identified. No peripancreatic fluid collections or inflammatory changes. Spleen: There is a round area in the medial aspect of the spleen estimated to measure approximately 3.0 x 2.9 cm (axial image 14 of series 15) which is isointense to spleen on T1 and T2 weighted images, and demonstrates avid arterial phase hyperenhancement with less intense signal intensity on delayed images which is similar to surrounding splenic parenchyma, most compatible with a benign splenic lesion, presumably a splenic hamartoma. Adrenals/Urinary Tract: Multiple T1 hypointense, T2 hyperintense, nonenhancing lesions are noted in the kidneys bilaterally, the majority of which are simple in appearance and compatible with simple (Bosniak class 1) cysts which require no imaging follow-up. The exception to this is a lesion in the interpolar region of the left kidney (axial image 15 of series 4 and coronal image 17 of series 3) estimated to measure approximately 5.1 x  5.0 x 3.6 cm, which has multiple (greater than 4) thin internal septations, but no definite enhancing internal soft tissue or mural nodularity, compatible with a Bosniak class 17F cyst. No other definite suspicious appearing renal lesions. No hydroureteronephrosis in the visualized portions of the abdomen. Bilateral adrenal glands are normal in appearance. Stomach/Bowel: Loops of small bowel in the right-side of the abdomen and central pelvis which  appears thickened and mildly dilated measuring up to 3.5 cm in diameter. Several colonic diverticuli are noted. Vascular/Lymphatic: No aneurysm identified in the visualized abdominal vasculature. No lymphadenopathy noted in the abdomen. Other: Trace amount of fluid adjacent to thickened small bowel loops in the right-side of the abdomen. New fluid in the left pericolic gutter. No significant volume of ascites noted in the visualized portions of the peritoneal cavity. Musculoskeletal: No aggressive appearing lytic or blastic lesions are noted in the visualized portions of the skeleton. IMPRESSION: 1. 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 No imaging follow-up is recommended. 2. 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 Given the patient's advanced age, repeat abdominal MRI with and without IV gadolinium with MRCP in 2 years is recommended to ensure the stability of this finding. 3. Bosniak class 17F cyst in the interpolar region of the left kidney. Repeat abdominal MRI with and without IV gadolinium is recommended in 6 months to ensure the stability of this finding. 4. Interval development of dilated loops of small bowel which appears thickened, in addition to new small volume of ascites adjacent to the thickened small bowel loops and in the left pericolic gutter. Findings are poorly evaluated by MRI. Repeat contrast-enhanced CT of the abdomen and pelvis should be considered, particularly if there is any clinical concern for bowel obstruction or enteritis. 5. Cholelithiasis without evidence of acute cholecystitis. 6. Colonic diverticulosis. Electronically Signed   By: Trudie Reed M.D.   On: 01/01/2023 15:09   CT HEAD WO CONTRAST ( )  Result Date: 01/01/2023 CLINICAL DATA:  82 year old female with  weakness, newly unable to ambulate, delirium. EXAM: CT HEAD WITHOUT CONTRAST TECHNIQUE: Contiguous axial images were obtained from the base of the skull through the vertex without intravenous contrast. RADIATION DOSE REDUCTION: This exam was performed according to the departmental dose-optimization program which includes automated exposure control, adjustment of the mA and/or kV according to patient size and/or use of iterative reconstruction technique. COMPARISON:  Head CT 12/26/2022. FINDINGS: Brain: Chronic small vessel disease with confluent bilateral cerebral white matter hypodensity, chronic heterogeneity in the deep gray nuclei especially the right thalamus. Stable cerebral volume. No midline shift, ventriculomegaly, mass effect, evidence of mass lesion, intracranial hemorrhage or evidence of cortically based acute infarction. Vascular: Calcified atherosclerosis at the skull base. No suspicious intracranial vascular hyperdensity. Skull: No acute osseous abnormality identified. Sinuses/Orbits: Visualized paranasal sinuses and mastoids are stable and well aerated. Other: No acute orbit or scalp soft tissue finding. IMPRESSION: No acute intracranial abnormality. 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 Electronically Signed   By: Odessa Fleming M.D.   On: 01/01/2023 06:37   CT ABDOMEN PELVIS W CONTRAST  Result Date: 12/26/2022 CLINICAL DATA:  Nausea, constipation, and poor oral intake. Suspected dehydration. EXAM: CT ABDOMEN AND PELVIS WITH CONTRAST TECHNIQUE: Multidetector CT imaging of the abdomen and pelvis was performed using the standard protocol following bolus administration  of intravenous contrast. RADIATION DOSE REDUCTION: This exam was performed according to the departmental dose-optimization program which includes automated exposure control, adjustment of the mA and/or kV according to patient size and/or use of iterative reconstruction technique. CONTRAST:  80mL OMNIPAQUE  IOHEXOL 300 MG/ML  SOLN COMPARISON:  CT abdomen and pelvis dated 04/14/2008 FINDINGS: Lower chest: No focal consolidation or pulmonary nodule in the lung bases. No pleural effusion or pneumothorax demonstrated. Partially imaged heart size is normal. Hepatobiliary: No focal hepatic lesions. No intra or extrahepatic biliary ductal dilation. Cholelithiasis. Pancreas: 5 mm hypoattenuating focus in the pancreatic head (2:36). No main ductal dilation. Spleen: Subtle, mildly hyperenhancing lobulated focus within the medial spleen measures 3.7 x 3.0 cm (2:16). The focus appears isoattenuating on the delayed images. Adrenals/Urinary Tract: No adrenal nodules. No suspicious renal mass, calculi or hydronephrosis. Bilateral simple/minimally complicated cyst. No specific follow-up imaging recommended. No focal bladder wall thickening. Stomach/Bowel: Normal appearance of the stomach. No evidence of bowel wall thickening, distention, or inflammatory changes. Colonic diverticulosis without acute diverticulitis. Small to moderate volume stool within the colon. Normal appendix. Vascular/Lymphatic: Aortic atherosclerosis. No enlarged abdominal or pelvic lymph nodes. Reproductive: No adnexal masses. Other: No free fluid, fluid collection, or free air. Musculoskeletal: No acute or abnormal lytic or blastic osseous lesions. Multilevel degenerative changes of the partially imaged thoracic and lumbar spine. IMPRESSION: 1. No acute abnormality in the abdomen or pelvis. 2. Subtle, mildly hyperenhancing lobulated focus within the medial spleen measures 3.7 x 3.0 cm. The focus appears isoattenuating on the delayed images. This is favored to represent a benign hemangioma. Recommend further evaluation with nonemergent MRI of the abdomen with and without contrast. 3. A 5 mm hypoattenuating focus in the pancreatic head, likely a side branch IPMN. No main ductal dilation. This finding can be assessed at the same time on the above recommended MRI.  4. Aortic Atherosclerosis (ICD10-I70.0). Electronically Signed   By: Agustin Cree M.D.   On: 12/26/2022 15:11   CT Head Wo Contrast  Result Date: 12/26/2022 CLINICAL DATA:  Memory loss EXAM: CT HEAD WITHOUT CONTRAST TECHNIQUE: Contiguous axial images were obtained from the base of the skull through the vertex without intravenous contrast. RADIATION DOSE REDUCTION: This exam was performed according to the departmental dose-optimization program which includes automated exposure control, adjustment of the mA and/or kV according to patient size and/or use of iterative reconstruction technique. COMPARISON:  None Available. FINDINGS: Brain: No CT evidence of an acute cortical infarct. 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 No hemorrhage. No hydrocephalus. Sequela of severe chronic microvascular ischemic change with extensive periventricular and subcortical white matter hypodensity. Generalized volume loss with disproportionate loss in the cerebellar vermis. Vascular: No hyperdense vessel or unexpected calcification. Skull: Normal. Negative for fracture or focal lesion. Sinuses/Orbits: No middle ear or mastoid effusion. Polypoid mucosal thickening left maxillary sinus. Bilateral lens replacement. Orbits are otherwise unremarkable. Other: None. IMPRESSION: 1. 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 2. Sequela of severe chronic microvascular ischemic change and generalized volume loss with disproportionate loss in the cerebellar vermis. Electronically Signed   By: Lorenza Cambridge M.D.   On: 12/26/2022 15:07   DG Chest Portable 1 View  Result Date: 12/26/2022 CLINICAL DATA:  Malaise. EXAM: PORTABLE CHEST 1 VIEW COMPARISON:  April 14, 2020. FINDINGS: The heart size and mediastinal contours are within normal limits. Both lungs are clear. The visualized skeletal structures are unremarkable. IMPRESSION: No active disease. Electronically Signed   By: Lupita Raider M.D.   On:  12/26/2022 11:53      Discharge Exam: Vitals:   01/02/23 0631 01/02/23 1007  BP: (!) 151/79 (!) 189/78  Pulse: 82 62  Resp: 18   Temp: 98.1 F (36.7 C)   SpO2: 97%    Vitals:   01/01/23 1757 01/01/23 2115 01/02/23 0631 01/02/23 1007  BP: (!) 173/82 (!) 140/61 (!) 151/79 (!) 189/78  Pulse: 76 64 82 62  Resp: 20 20 18    Temp: 98.6 F (37 C) 98.4 F (36.9 C) 98.1 F (36.7 C)   TempSrc: Oral     SpO2: 100% 95% 97%   Weight:      Height:        General: Pt is alert, awake, not in acute distress Cardiovascular: RRR, S1/S2 +, no rubs, no gallops Respiratory: CTA bilaterally, no wheezing, no rhonchi Abdominal: Soft, NT, ND, bowel sounds + Extremities: no edema, no cyanosis    The results of significant diagnostics from this hospitalization (including imaging, microbiology, ancillary and laboratory) are listed below for reference.     Microbiology: Recent Results (from the past 240 hour(s))  Urine Culture     Status: Abnormal   Collection Time: 12/26/22 12:48 PM   Specimen: Urine, Clean Catch  Result Value Ref Range Status   Specimen Description   Final    URINE, CLEAN CATCH Performed at [facility], [address], [place], [place] [address]    Special Requests   Final    NONE Performed at [facility], [address] [place], [place] [address]    Culture MULTIPLE SPECIES PRESENT, SUGGEST RECOLLECTION (A)  Final   Report Status 12/27/2022 FINAL  Final  SARS Coronavirus 2 by RT PCR (hospital order, performed in [facility] hospital lab) *cepheid single result test* Anterior Nasal Swab     Status: None   Collection Time: 12/26/22  2:13 PM   Specimen: Anterior Nasal Swab  Result Value Ref Range Status   SARS Coronavirus 2 by RT PCR NEGATIVE NEGATIVE Final    Comment: (NOTE) SARS-CoV-2 target nucleic acids are NOT DETECTED.  The SARS-CoV-2 RNA is generally detectable in upper and lower respiratory specimens during the acute phase of infection. The lowest concentration of SARS-CoV-2  viral copies this assay can detect is 250 copies / mL. A negative result does not preclude SARS-CoV-2 infection and should not be used as the sole basis for treatment or other patient management decisions.  A negative result may occur with improper specimen collection / handling, submission of specimen other than nasopharyngeal swab, presence of viral mutation(s) within the areas targeted by this assay, and inadequate number of viral copies (<250 copies / mL). A negative result must be combined with clinical observations, patient history, and epidemiological information.  Fact Sheet for Patients:   RoadLapTop.co.za  Fact Sheet for Healthcare Providers: http://kim-miller.com/  This test is not yet approved or  cleared by the [place] FDA and has been authorized for detection and/or diagnosis of SARS-CoV-2 by FDA under an Emergency Use Authorization (EUA).  This EUA will remain in effect (meaning this test can be used) for the duration of the COVID-19 declaration under Section 564(b)(1) of the Act, 21 U.S.C. section 360bbb-3(b)(1), unless the authorization is terminated or revoked sooner.  Performed at [facility], [address], [place], [place] [address]   Culture, blood (single)     Status: None (Preliminary result)   Collection Time: 01/01/23  6:45 AM   Specimen: BLOOD  Result Value Ref Range Status   Specimen  Description BLOOD RIGHT ANTECUBITAL  Final   Special Requests   Final    BOTTLES DRAWN AEROBIC AND ANAEROBIC Blood Culture adequate volume   Culture   Final    NO GROWTH 1 DAY Performed at [facility], [address], [place], [place] [address]    Report Status PENDING  Incomplete     Labs: BNP (last 3 results) No results for input(s): "BNP" in the last 8760 hours. Basic Metabolic Panel: Recent Labs  Lab 01/01/23 0549 01/02/23 0651  NA 138 139  K 3.2* 3.6  CL 101 106  CO2 29 27  GLUCOSE 138* 99  BUN 10 13   CREATININE 0.89 0.85  CALCIUM 9.4 9.4  MG  --  1.9   Liver Function Tests: Recent Labs  Lab 01/02/23 0651  AST 16  ALT 18  ALKPHOS 45  BILITOT 1.5*  PROT 5.5*  ALBUMIN 3.4*   No results for input(s): "LIPASE", "AMYLASE" in the last 168 hours. No results for input(s): "AMMONIA" in the last 168 hours. CBC: Recent Labs  Lab 01/01/23 0549 01/02/23 0651  WBC 8.4 7.7  HGB 13.3 12.9  HCT 41.9 40.5  MCV 91.3 92.3  PLT 177 170   Cardiac Enzymes: No results for input(s): "CKTOTAL", "CKMB", "CKMBINDEX", "TROPONINI" in the last 168 hours. BNP: Invalid input(s): "POCBNP" CBG: No results for input(s): "GLUCAP" in the last 168 hours. D-Dimer No results for input(s): "DDIMER" in the last 72 hours. Hgb A1c No results for input(s): "HGBA1C" in the last 72 hours. Lipid Profile No results for input(s): "CHOL", "HDL", "LDLCALC", "TRIG", "CHOLHDL", "LDLDIRECT" in the last 72 hours. Thyroid function studies No results for input(s): "TSH", "T4TOTAL", "T3FREE", "THYROIDAB" in the last 72 hours.  Invalid input(s): "FREET3" Anemia work up No results for input(s): "VITAMINB12", "FOLATE", "FERRITIN", "TIBC", "IRON", "RETICCTPCT" in the last 72 hours. Urinalysis    Component Value Date/Time   COLORURINE YELLOW 01/01/2023 0437   APPEARANCEUR CLOUDY (A) 01/01/2023 0437   LABSPEC 1.006 01/01/2023 0437   PHURINE 8.0 01/01/2023 0437   GLUCOSEU 50 (A) 01/01/2023 0437   HGBUR SMALL (A) 01/01/2023 0437   BILIRUBINUR NEGATIVE 01/01/2023 0437   KETONESUR 5 (A) 01/01/2023 0437   PROTEINUR 30 (A) 01/01/2023 0437   UROBILINOGEN 0.2 04/14/2008 1005   NITRITE NEGATIVE 01/01/2023 0437   LEUKOCYTESUR NEGATIVE 01/01/2023 0437   Sepsis Labs Recent Labs  Lab 01/01/23 0549 01/02/23 0651  WBC 8.4 7.7   Microbiology Recent Results (from the past 240 hour(s))  Urine Culture     Status: Abnormal   Collection Time: 12/26/22 12:48 PM   Specimen: Urine, Clean Catch  Result Value Ref Range Status    Specimen Description   Final    URINE, CLEAN CATCH Performed at [facility], [address] [place], [place] [address]    Special Requests   Final    NONE Performed at [facility], [address], [place], [place] [address]    Culture MULTIPLE SPECIES PRESENT, SUGGEST RECOLLECTION (A)  Final   Report Status 12/27/2022 FINAL  Final  SARS Coronavirus 2 by RT PCR (hospital order, performed in [facility] hospital lab) *cepheid single result test* Anterior Nasal Swab     Status: None   Collection Time: 12/26/22  2:13 PM   Specimen: Anterior Nasal Swab  Result Value Ref Range Status   SARS Coronavirus 2 by RT PCR NEGATIVE NEGATIVE Final    Comment: (NOTE) SARS-CoV-2 target nucleic acids are NOT DETECTED.  The SARS-CoV-2 RNA is generally  detectable in upper and lower respiratory specimens during the acute phase of infection. The lowest concentration of SARS-CoV-2 viral copies this assay can detect is 250 copies / mL. A negative result does not preclude SARS-CoV-2 infection and should not be used as the sole basis for treatment or other patient management decisions.  A negative result may occur with improper specimen collection / handling, submission of specimen other than nasopharyngeal swab, presence of viral mutation(s) within the areas targeted by this assay, and inadequate number of viral copies (<250 copies / mL). A negative result must be combined with clinical observations, patient history, and epidemiological information.  Fact Sheet for Patients:   RoadLapTop.co.za  Fact Sheet for Healthcare Providers: http://kim-miller.com/  This test is not yet approved or  cleared by the [place] FDA and has been authorized for detection and/or diagnosis of SARS-CoV-2 by FDA under an Emergency Use Authorization (EUA).  This EUA will remain in effect (meaning this test can be used) for the duration of the COVID-19 declaration under Section  564(b)(1) of the Act, 21 U.S.C. section 360bbb-3(b)(1), unless the authorization is terminated or revoked sooner.  Performed at [facility], [address], [place], [place] [address]   Culture, blood (single)     Status: None (Preliminary result)   Collection Time: 01/01/23  6:45 AM   Specimen: BLOOD  Result Value Ref Range Status   Specimen Description BLOOD RIGHT ANTECUBITAL  Final   Special Requests   Final    BOTTLES DRAWN AEROBIC AND ANAEROBIC Blood Culture adequate volume   Culture   Final    NO GROWTH 1 DAY Performed at [facility] [address] [place], [place] [address]    Report Status PENDING  Incomplete     Time coordinating discharge: 35 minutes  SIGNED:   Erick Blinks, DO [facility] 01/02/2023, 12:40 PM  If 7PM-7AM, please contact night-coverage [email or web address]

## 2023-01-02 NOTE — Progress Notes (Signed)
Patient has been confused and agitated since about 2100. Patient feels as if she is being held hostage and keeps asking for her phone. Granddaughter keeps educating patient that phone is at home with husband.  Granddaughter has been at bedside and has not been able to calm patient despite several attempts.  Patient has shown increased strength, able to use bsc with asssistance.  Notified md, order for medication received.

## 2023-01-02 NOTE — Progress Notes (Signed)
Nsg Discharge Note  Admit Date:  01/01/2023 Discharge date: 01/02/2023   SYMPHANI BOWAN to be D/C'd Home per MD order.  AVS completed.  Patient/caregiver able to verbalize understanding.  Discharge Medication: Allergies as of 01/02/2023   No Known Allergies      Medication List     TAKE these medications    acetaminophen 500 MG tablet Commonly known as: TYLENOL Take 1,000 mg by mouth daily as needed for mild pain.   ALPRAZolam 0.25 MG tablet Commonly known as: XANAX Take 0.25 mg by mouth daily as needed for anxiety.   atorvastatin 10 MG tablet Commonly known as: LIPITOR Take 10 mg by mouth daily.   B-12 COMPLIANCE INJECTION IJ Inject as directed. One monthly   carvedilol 3.125 MG tablet Commonly known as: COREG Take 3.125 mg by mouth 2 (two) times daily with a meal.   feeding supplement Liqd Take 237 mLs by mouth 2 (two) times daily between meals.   guaiFENesin-dextromethorphan 100-10 MG/5ML syrup Commonly known as: ROBITUSSIN DM Take 10 mLs by mouth every 4 (four) hours as needed for cough.   hydrALAZINE 50 MG tablet Commonly known as: APRESOLINE Take 50 mg by mouth 3 (three) times daily.   LORazepam 0.5 MG tablet Commonly known as: Ativan Take 1/2 tablet 1 hour prior to MRI, can take second half 30 minutes prior if anxiety still present   losartan 100 MG tablet Commonly known as: COZAAR Take 1 tablet (100 mg total) by mouth daily.   ondansetron 4 MG tablet Commonly known as: Zofran Take 1 tablet (4 mg total) by mouth daily as needed for nausea or vomiting.   ondansetron 8 MG disintegrating tablet Commonly known as: ZOFRAN-ODT Take 8 mg by mouth every 8 (eight) hours as needed for nausea.   zinc sulfate 220 (50 Zn) MG capsule Take 1 capsule (220 mg total) by mouth daily.        Discharge Assessment: Vitals:   01/02/23 0631 01/02/23 1007  BP: (!) 151/79 (!) 189/78  Pulse: 82 62  Resp: 18   Temp: 98.1 F (36.7 C)   SpO2: 97%    Skin clean,  dry and intact without evidence of skin break down, no evidence of skin tears noted. IV catheter discontinued intact. Site without signs and symptoms of complications - no redness or edema noted at insertion site, patient denies c/o pain - only slight tenderness at site.  Dressing with slight pressure applied.  D/c Instructions-Education: Discharge instructions given to patient/family with verbalized understanding. D/c education completed with patient/family including follow up instructions, medication list, d/c activities limitations if indicated, with other d/c instructions as indicated by MD - patient able to verbalize understanding, all questions fully answered. Patient instructed to return to ED, call 911, or call MD for any changes in condition.  Patient escorted via WC, and D/C home via private auto.  Laurena Spies, RN 01/02/2023 12:56 PM

## 2023-01-02 NOTE — Evaluation (Signed)
Physical Therapy Evaluation Patient Details Name: [name] MRN: [id number] DOB: 05-05-1941 Today's Date: 01/02/2023  History of Present Illness  [name] is a 82 y.o. female with medical history significant for hypertension and recurrent kidney stones who presented to the ED due to complaints of weakness and malaise as well as decreased appetite and persistent nausea with dry heaving.  She is also noted to have some confusion per son at bedside.  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  She has followed up with GI on 6/11 with plans to have MRI.  She continues to have some nausea with dry heaving and has not taken her home blood pressure medications in the last several days.  She denies any diarrhea, fevers, or chills.  She has notably had a 20 pound weight loss that was unintentional over the past year and also has some trouble with constipation.   Clinical Impression  Patient functioning near baseline for functional mobility and gait other than frequent drifting left/right during ambulation using SPC, safer using RW and advised to use RW and follow up with HHPT to advance back to cane. Patient tolerated sitting up in chair with family members present after therapy.  PLAN:  Patient to be discharged home today and discharged from acute physical therapy to care of nursing for ambulation as tolerated for length of stay with recommendations stated below          Recommendations for follow up therapy are one component of a multi-disciplinary discharge planning process, led by the attending physician.  Recommendations may be updated based on patient status, additional functional criteria and insurance authorization.  Follow Up Recommendations       Assistance Recommended at Discharge Set up Supervision/Assistance   Patient can return home with the following  A little help with walking and/or transfers;A little help with bathing/dressing/bathroom;Help with stairs or ramp for entrance;Assistance with cooking/housework    Equipment Recommendations None recommended by PT  Recommendations for Other Services       Functional Status Assessment Patient has had a recent decline in their functional status and demonstrates the ability to make significant improvements in function in a reasonable and predictable amount of time.     Precautions / Restrictions Precautions Precautions: Fall Restrictions Weight Bearing Restrictions: No      Mobility  Bed Mobility Overal bed mobility: Modified Independent                  Transfers Overall transfer level: Modified independent                      Ambulation/Gait Ambulation/Gait assistance: Supervision, Min guard Gait Distance (Feet): 65 Feet Assistive device: Rolling walker (2 wheels), Straight cane Gait Pattern/deviations: Decreased step length - right, Decreased step length - left, Decreased stride length, Drifts right/left Gait velocity: decreased     General Gait Details: slow slightly labored cadence with frequent drifting right/left and having to lean on side rail using SPC, safer using RW wtihout loss of balance and reduced drifting left/right  Stairs            Wheelchair Mobility    Modified Rankin (Stroke Patients Only)       Balance Overall balance assessment: Needs assistance Sitting-balance support: Feet supported, No upper extremity supported Sitting  balance-Leahy Scale: Good Sitting balance - Comments: seated at EOB   Standing balance support: During functional activity, No upper extremity supported Standing balance-Leahy Scale: Poor Standing balance comment: fair/poor without AD and using SPC, fair/good using RW                             Pertinent Vitals/Pain Pain Assessment Pain  Assessment: No/denies pain    Home Living Family/patient expects to be discharged to:: Private residence Living Arrangements: Spouse/significant other Available Help at Discharge: Family;Available 24 hours/day Type of Home: House Home Access: Stairs to enter Entrance Stairs-Rails: Right;Left;Can reach both Entrance Stairs-Number of Steps: 6   Home Layout: One level Home Equipment: Agricultural consultant (2 wheels);Cane - single point      Prior Function Prior Level of Function : Independent/Modified Independent             Mobility Comments: household and short distanced community ambulator using [facility] ADLs Comments: Assisted by family     Hand Dominance   Dominant Hand: Right    Extremity/Trunk Assessment   Upper Extremity Assessment Upper Extremity Assessment: Overall WFL for tasks assessed    Lower Extremity Assessment Lower Extremity Assessment: Generalized weakness    Cervical / Trunk Assessment Cervical / Trunk Assessment: Normal  Communication   Communication: No difficulties  Cognition Arousal/Alertness: Awake/alert Behavior During Therapy: WFL for tasks assessed/performed Overall Cognitive Status: Within Functional Limits for tasks assessed                                          General Comments      Exercises     Assessment/Plan    PT Assessment All further PT needs can be met in the next venue of care  PT Problem List Decreased strength;Decreased activity tolerance;Decreased balance;Decreased mobility       PT Treatment Interventions      PT Goals (Current goals can be found in the Care Plan section)  Acute Rehab PT Goals Patient Stated Goal: return home with family to assist PT Goal Formulation: With patient/family Time For Goal Achievement: 01/02/23 Potential to Achieve Goals: Good    Frequency       Co-evaluation               AM-PAC PT "6 Clicks" Mobility  Outcome Measure Help needed turning from your back  to your side while in a flat bed without using bedrails?: None Help needed moving from lying on your back to sitting on the side of a flat bed without using bedrails?: None Help needed moving to and from a bed to a chair (including a wheelchair)?: None Help needed standing up from a chair using your arms (e.g., wheelchair or bedside chair)?: None Help needed to walk in hospital room?: A Little Help needed climbing 3-5 steps with a railing? : A Lot 6 Click Score: 21    End of Session   Activity Tolerance: Patient tolerated treatment well;Patient limited by fatigue Patient left: in chair;with call bell/phone within reach;with family/visitor present Nurse Communication: Mobility status PT Visit Diagnosis: Unsteadiness on feet (R26.81);Other abnormalities of gait and mobility (R26.89);Muscle weakness (generalized) (M62.81)    Time: 1100-1117 PT Time Calculation (min) (ACUTE ONLY): 17 min   Charges:   PT Evaluation $PT Eval Low Complexity: 1 Low PT Treatments $Therapeutic Activity: 8-22 mins  1:42 PM, 01/02/23 Ocie Bob, MPT Physical Therapist with [facility] [phone] office [phone] mobile phone

## 2023-01-02 NOTE — Progress Notes (Signed)
Patient rested well and was calmer after dose of prn medication.   Granddaughter has been at bedside all night.  Vitals have been stable.

## 2023-01-03 LAB — CULTURE, BLOOD (SINGLE)

## 2023-01-05 NOTE — Progress Notes (Signed)
MRCP cancelled

## 2023-01-06 DIAGNOSIS — D518 Other vitamin B12 deficiency anemias: Secondary | ICD-10-CM | POA: Diagnosis not present

## 2023-01-06 LAB — CULTURE, BLOOD (SINGLE)

## 2023-01-09 NOTE — Progress Notes (Signed)
  [name]. Pilar Plate, MD [facility] [email or web address]

## 2023-01-14 ENCOUNTER — Ambulatory Visit (HOSPITAL_COMMUNITY): Payer: Medicare Other

## 2023-01-19 ENCOUNTER — Observation Stay (HOSPITAL_COMMUNITY)
Admission: EM | Admit: 2023-01-19 | Discharge: 2023-01-20 | Disposition: A | Discharge status: Home / Self Care | Payer: Medicare Other | Attending: Internal Medicine | Admitting: Internal Medicine

## 2023-01-19 ENCOUNTER — Emergency Department (HOSPITAL_COMMUNITY): Payer: Medicare Other

## 2023-01-19 ENCOUNTER — Other Ambulatory Visit: Payer: Self-pay

## 2023-01-19 ENCOUNTER — Encounter (HOSPITAL_COMMUNITY): Payer: Self-pay | Admitting: Emergency Medicine

## 2023-01-19 DIAGNOSIS — K869 Disease of pancreas, unspecified: Secondary | ICD-10-CM | POA: Diagnosis present

## 2023-01-19 DIAGNOSIS — R262 Difficulty in walking, not elsewhere classified: Secondary | ICD-10-CM

## 2023-01-19 DIAGNOSIS — K8689 Other specified diseases of pancreas: Secondary | ICD-10-CM | POA: Insufficient documentation

## 2023-01-19 DIAGNOSIS — R2681 Unsteadiness on feet: Secondary | ICD-10-CM | POA: Diagnosis not present

## 2023-01-19 DIAGNOSIS — M6281 Muscle weakness (generalized): Secondary | ICD-10-CM | POA: Insufficient documentation

## 2023-01-19 DIAGNOSIS — R42 Dizziness and giddiness: Secondary | ICD-10-CM

## 2023-01-19 DIAGNOSIS — R6889 Other general symptoms and signs: Secondary | ICD-10-CM | POA: Diagnosis not present

## 2023-01-19 DIAGNOSIS — D7389 Other diseases of spleen: Secondary | ICD-10-CM | POA: Insufficient documentation

## 2023-01-19 DIAGNOSIS — R2689 Other abnormalities of gait and mobility: Secondary | ICD-10-CM | POA: Diagnosis not present

## 2023-01-19 DIAGNOSIS — F0390 Unspecified dementia without behavioral disturbance: Secondary | ICD-10-CM

## 2023-01-19 DIAGNOSIS — I10 Essential (primary) hypertension: Secondary | ICD-10-CM | POA: Diagnosis not present

## 2023-01-19 DIAGNOSIS — R0689 Other abnormalities of breathing: Secondary | ICD-10-CM | POA: Diagnosis not present

## 2023-01-19 DIAGNOSIS — R531 Weakness: Secondary | ICD-10-CM | POA: Diagnosis not present

## 2023-01-19 DIAGNOSIS — Z79899 Other long term (current) drug therapy: Secondary | ICD-10-CM | POA: Diagnosis not present

## 2023-01-19 DIAGNOSIS — Z87891 Personal history of nicotine dependence: Secondary | ICD-10-CM | POA: Insufficient documentation

## 2023-01-19 DIAGNOSIS — R634 Abnormal weight loss: Secondary | ICD-10-CM | POA: Diagnosis not present

## 2023-01-19 DIAGNOSIS — Z743 Need for continuous supervision: Secondary | ICD-10-CM | POA: Diagnosis not present

## 2023-01-19 DIAGNOSIS — R29818 Other symptoms and signs involving the nervous system: Secondary | ICD-10-CM | POA: Diagnosis not present

## 2023-01-19 DIAGNOSIS — R5383 Other fatigue: Secondary | ICD-10-CM | POA: Diagnosis not present

## 2023-01-19 LAB — CBC WITH DIFFERENTIAL/PLATELET
Abs Immature Granulocytes: 0.03 10*3/uL (ref 0.00–0.07)
Basophils Absolute: 0 10*3/uL (ref 0.0–0.1)
Basophils Relative: 1 %
Eosinophils Absolute: 0.1 10*3/uL (ref 0.0–0.5)
Eosinophils Relative: 2 %
HCT: 42.2 % (ref 36.0–46.0)
Hemoglobin: 13.6 g/dL (ref 12.0–15.0)
Immature Granulocytes: 0 %
Lymphocytes Relative: 17 %
Lymphs Abs: 1.2 10*3/uL (ref 0.7–4.0)
MCH: 29.4 pg (ref 26.0–34.0)
MCHC: 32.2 g/dL (ref 30.0–36.0)
MCV: 91.3 fL (ref 80.0–100.0)
Monocytes Absolute: 0.6 10*3/uL (ref 0.1–1.0)
Monocytes Relative: 8 %
Neutro Abs: 5.1 10*3/uL (ref 1.7–7.7)
Neutrophils Relative %: 72 %
Platelets: 282 10*3/uL (ref 150–400)
RBC: 4.62 MIL/uL (ref 3.87–5.11)
RDW: 14.4 % (ref 11.5–15.5)
WBC: 7.1 10*3/uL (ref 4.0–10.5)
nRBC: 0 % (ref 0.0–0.2)

## 2023-01-19 LAB — URINALYSIS, ROUTINE W REFLEX MICROSCOPIC
Bilirubin Urine: NEGATIVE
Glucose, UA: NEGATIVE mg/dL
Ketones, ur: 5 mg/dL — AB
Leukocytes,Ua: NEGATIVE
Nitrite: NEGATIVE
Protein, ur: 100 mg/dL — AB
Specific Gravity, Urine: 1.012 (ref 1.005–1.030)
pH: 7 (ref 5.0–8.0)

## 2023-01-19 LAB — COMPREHENSIVE METABOLIC PANEL
ALT: 28 U/L (ref 0–44)
AST: 21 U/L (ref 15–41)
Albumin: 3.7 g/dL (ref 3.5–5.0)
Alkaline Phosphatase: 126 U/L (ref 38–126)
Anion gap: 9 (ref 5–15)
BUN: 17 mg/dL (ref 8–23)
CO2: 27 mmol/L (ref 22–32)
Calcium: 9.9 mg/dL (ref 8.9–10.3)
Chloride: 104 mmol/L (ref 98–111)
Creatinine, Ser: 0.83 mg/dL (ref 0.44–1.00)
GFR, Estimated: >60 mL/min (ref >60)
Glucose, Bld: 128 mg/dL — ABNORMAL HIGH (ref 70–99)
Potassium: 3.5 mmol/L (ref 3.5–5.1)
Sodium: 140 mmol/L (ref 135–145)
Total Bilirubin: 1.2 mg/dL (ref 0.3–1.2)
Total Protein: 7.1 g/dL (ref 6.5–8.1)

## 2023-01-19 LAB — TROPONIN I (HIGH SENSITIVITY)
Troponin I (High Sensitivity): 10 ng/L (ref <18)
Troponin I (High Sensitivity): 11 ng/L (ref <18)

## 2023-01-19 LAB — LIPASE, BLOOD: Lipase: 48 U/L (ref 11–51)

## 2023-01-19 MED ORDER — LOSARTAN POTASSIUM 50 MG PO TABS
100.0000 mg | ORAL_TABLET | Freq: Every day | ORAL | Status: DC
  Administered 2023-01-19 – 2023-01-20 (×2): 100 mg via ORAL
  Filled 2023-01-19 (×2): qty 2

## 2023-01-19 MED ORDER — ENSURE ENLIVE PO LIQD
237.0000 mL | Freq: Two times a day (BID) | ORAL | Status: DC
  Administered 2023-01-20: 237 mL via ORAL
  Filled 2023-01-19: qty 237

## 2023-01-19 MED ORDER — ONDANSETRON HCL 4 MG/2ML IJ SOLN
4.0000 mg | Freq: Once | INTRAMUSCULAR | Status: AC
  Administered 2023-01-19: 4 mg via INTRAVENOUS
  Filled 2023-01-19: qty 2

## 2023-01-19 MED ORDER — ATORVASTATIN CALCIUM 10 MG PO TABS
10.0000 mg | ORAL_TABLET | Freq: Every day | ORAL | Status: DC
  Administered 2023-01-19 – 2023-01-20 (×2): 10 mg via ORAL
  Filled 2023-01-19 (×2): qty 1

## 2023-01-19 MED ORDER — GABAPENTIN 100 MG PO CAPS
100.0000 mg | ORAL_CAPSULE | Freq: Once | ORAL | Status: AC
  Administered 2023-01-19: 100 mg via ORAL
  Filled 2023-01-19: qty 1

## 2023-01-19 MED ORDER — LOSARTAN POTASSIUM 25 MG PO TABS
100.0000 mg | ORAL_TABLET | Freq: Once | ORAL | Status: AC
  Administered 2023-01-19: 100 mg via ORAL
  Filled 2023-01-19: qty 4

## 2023-01-19 MED ORDER — HYDRALAZINE HCL 25 MG PO TABS
50.0000 mg | ORAL_TABLET | Freq: Three times a day (TID) | ORAL | Status: DC
  Administered 2023-01-19 – 2023-01-20 (×3): 50 mg via ORAL
  Filled 2023-01-19 (×3): qty 2

## 2023-01-19 MED ORDER — METOCLOPRAMIDE HCL 5 MG/ML IJ SOLN
5.0000 mg | Freq: Once | INTRAMUSCULAR | Status: AC
  Administered 2023-01-19: 5 mg via INTRAVENOUS
  Filled 2023-01-19: qty 2

## 2023-01-19 MED ORDER — ENOXAPARIN SODIUM 40 MG/0.4ML IJ SOSY
40.0000 mg | PREFILLED_SYRINGE | INTRAMUSCULAR | Status: DC
  Administered 2023-01-19: 40 mg via SUBCUTANEOUS
  Filled 2023-01-19: qty 0.4

## 2023-01-19 MED ORDER — SODIUM CHLORIDE 0.9% FLUSH
3.0000 mL | Freq: Two times a day (BID) | INTRAVENOUS | Status: DC
  Administered 2023-01-19 – 2023-01-20 (×2): 3 mL via INTRAVENOUS

## 2023-01-19 MED ORDER — HYDRALAZINE HCL 25 MG PO TABS
50.0000 mg | ORAL_TABLET | Freq: Once | ORAL | Status: AC
  Administered 2023-01-19: 50 mg via ORAL
  Filled 2023-01-19: qty 2

## 2023-01-19 MED ORDER — ONDANSETRON HCL 4 MG PO TABS: 4.0000 mg | ORAL_TABLET | Freq: Four times a day (QID) | ORAL | Status: DC | PRN

## 2023-01-19 MED ORDER — HYDRALAZINE HCL 20 MG/ML IJ SOLN
10.0000 mg | INTRAMUSCULAR | Status: DC | PRN
  Filled 2023-01-19: qty 1

## 2023-01-19 MED ORDER — MECLIZINE HCL 12.5 MG PO TABS
25.0000 mg | ORAL_TABLET | Freq: Once | ORAL | Status: AC
  Administered 2023-01-19: 25 mg via ORAL
  Filled 2023-01-19: qty 2

## 2023-01-19 MED ORDER — SODIUM CHLORIDE 0.9 % IV SOLN: 250.0000 mL | INTRAVENOUS | Status: DC | PRN

## 2023-01-19 MED ORDER — SODIUM CHLORIDE 0.9% FLUSH: 3.0000 mL | INTRAVENOUS | Status: DC | PRN

## 2023-01-19 MED ORDER — LORAZEPAM 2 MG/ML IJ SOLN
1.0000 mg | Freq: Once | INTRAMUSCULAR | Status: DC
  Filled 2023-01-19: qty 1

## 2023-01-19 MED ORDER — SODIUM CHLORIDE 0.9 % IV BOLUS
500.0000 mL | Freq: Once | INTRAVENOUS | Status: AC
  Administered 2023-01-19: 500 mL via INTRAVENOUS

## 2023-01-19 MED ORDER — ACETAMINOPHEN 650 MG RE SUPP: 650.0000 mg | Freq: Four times a day (QID) | RECTAL | Status: DC | PRN

## 2023-01-19 MED ORDER — ALPRAZOLAM 0.25 MG PO TABS
0.2500 mg | ORAL_TABLET | Freq: Every day | ORAL | Status: DC | PRN
  Administered 2023-01-19 – 2023-01-20 (×2): 0.25 mg via ORAL
  Filled 2023-01-19 (×2): qty 1

## 2023-01-19 MED ORDER — ACETAMINOPHEN 325 MG PO TABS
650.0000 mg | ORAL_TABLET | Freq: Four times a day (QID) | ORAL | Status: DC | PRN
  Administered 2023-01-19: 650 mg via ORAL
  Filled 2023-01-19: qty 2

## 2023-01-19 MED ORDER — CARVEDILOL 3.125 MG PO TABS
3.1250 mg | ORAL_TABLET | Freq: Two times a day (BID) | ORAL | Status: DC
  Administered 2023-01-19 – 2023-01-20 (×2): 3.125 mg via ORAL
  Filled 2023-01-19 (×2): qty 1

## 2023-01-19 MED ORDER — HYDRALAZINE HCL 20 MG/ML IJ SOLN
10.0000 mg | Freq: Once | INTRAMUSCULAR | Status: AC
  Administered 2023-01-19: 10 mg via INTRAVENOUS
  Filled 2023-01-19: qty 1

## 2023-01-19 MED ORDER — ONDANSETRON HCL 4 MG/2ML IJ SOLN: 4.0000 mg | Freq: Four times a day (QID) | INTRAMUSCULAR | Status: DC | PRN

## 2023-01-19 MED ORDER — DIPHENHYDRAMINE HCL 50 MG/ML IJ SOLN
12.5000 mg | Freq: Once | INTRAMUSCULAR | Status: AC
  Administered 2023-01-19: 12.5 mg via INTRAVENOUS
  Filled 2023-01-19: qty 1

## 2023-01-19 NOTE — ED Provider Notes (Signed)
  Provider Note MRN:  [id number]  Arrival date & time: 01/22/23    ED Course and Medical Decision Making  Assumed care from dR dELO at shift change.  See note from prior team for complete details, in brief:   82 yo female Generalized weakness Woke up from sleep feeling weak/dizzy/can't turn head  MRI pending w/ ativan Zofran/antivert If neg and feeling better go hoem  Plan per prior physician follow-up on MRI and recheck  Clinical Course as of 01/22/23 0807  [facility] Jan 19, 2023  4098 Feeling somewhat better, MRI is w/o acute cva. Will give home bp medication which she has not taken yet today and recheck [SG]  1146 Attempted to ambulate pt, unable to get up with 2 person assist [SG]  1316 Attempted again with tech and family at bedside, pt unable to stand [SG]    Clinical Course User Index [SG] Sloan Leiter, DO    Patient unable to ambulate, will consult hospitalist for admission   Procedures  Final Clinical Impressions(s) / ED Diagnoses     ICD-10-CM   1. Weakness  R53.1     2. Unable to ambulate  R26.2     3. Dizzy  R42     4. Dementia without behavioral disturbance (HCC)  F03.90 Ambulatory referral to Neurology      ED Discharge Orders          Ordered    Increase activity slowly        01/20/23 1230    Diet - low sodium heart healthy        01/20/23 1230    Ambulatory referral to Neurology       Comments: An appointment is requested in approximately: 1 week   01/20/23 1230            Discharge Instructions   None        Sloan Leiter, DO 01/22/23 1191

## 2023-01-19 NOTE — ED Provider Notes (Signed)
[place] EMERGENCY DEPARTMENT AT [facility] Provider Note   CSN: [id number] Arrival date & time: 01/19/23  4098     History  Chief Complaint  Patient presents with   Weakness    [name] is a 82 y.o. female.  Patient is an 82 year old female with history of chronic renal insufficiency, hypertension, hyperlipidemia.  Patient presenting today with complaints of "feeling sick".  She apparently woke from sleep with weakness and feeling generally unwell.  Patient is having difficulty elaborating.  She denies to me she is having any pain.  I am also told that her blood pressure was markedly elevated.  No aggravating or alleviating factors.  The history is provided by the patient.       Home Medications Prior to Admission medications   Medication Sig Start Date End Date Taking? Authorizing Provider  acetaminophen (TYLENOL) 500 MG tablet Take 1,000 mg by mouth daily as needed for mild pain.    [provider]  ALPRAZolam Prudy Feeler) 0.25 MG tablet Take 0.25 mg by mouth daily as needed for anxiety.    [provider]  atorvastatin (LIPITOR) 10 MG tablet Take 10 mg by mouth daily.     [provider]  carvedilol (COREG) 3.125 MG tablet Take 3.125 mg by mouth 2 (two) times daily with a meal.    [provider]  Cyanocobalamin (B-12 COMPLIANCE INJECTION IJ) Inject as directed. One monthly    [provider]  feeding supplement, ENSURE ENLIVE, (ENSURE ENLIVE) LIQD Take 237 mLs by mouth 2 (two) times daily between meals. Patient not taking: Reported on 12/30/2022 04/16/20   Shon Hale, MD  guaiFENesin-dextromethorphan (ROBITUSSIN DM) 100-10 MG/5ML syrup Take 10 mLs by mouth every 4 (four) hours as needed for cough. Patient not taking: Reported on 01/02/2023 04/16/20   Shon Hale, MD  hydrALAZINE (APRESOLINE) 50 MG tablet Take 50 mg by mouth 3 (three) times daily. 03/03/19   [provider]  LORazepam (ATIVAN) 0.5 MG  tablet Take 1/2 tablet 1 hour prior to MRI, can take second half 30 minutes prior if anxiety still present Patient not taking: Reported on 01/02/2023 12/30/22   Carlan, Jeral Pinch, NP  losartan (COZAAR) 100 MG tablet Take 1 tablet (100 mg total) by mouth daily. 04/16/20   Shon Hale, MD  ondansetron (ZOFRAN) 4 MG tablet Take 1 tablet (4 mg total) by mouth daily as needed for nausea or vomiting. 01/02/23 01/02/24  Sherryll Burger, Pratik D, DO  ondansetron (ZOFRAN-ODT) 8 MG disintegrating tablet Take 8 mg by mouth every 8 (eight) hours as needed for nausea. 12/18/22   [provider]  zinc sulfate 220 (50 Zn) MG capsule Take 1 capsule (220 mg total) by mouth daily. Patient not taking: Reported on 01/02/2023 04/17/20   Shon Hale, MD      Allergies    Patient has no known allergies.    Review of Systems   Review of Systems  All other systems reviewed and are negative.   Physical Exam Updated Vital Signs BP (!) 172/114   Pulse 80   Temp 98.2 F (36.8 C) (Oral)   Resp 15   Wt 55.9 kg   SpO2 95%   BMI 17.68 kg/m  Physical Exam Vitals and nursing note reviewed.  Constitutional:      General: She is not in acute distress.    Appearance: She is well-developed. She is not diaphoretic.  HENT:     Head: Normocephalic and atraumatic.  Cardiovascular:  Rate and Rhythm: Normal rate and regular rhythm.     Heart sounds: No murmur heard.    No friction rub. No gallop.  Pulmonary:     Effort: Pulmonary effort is normal. No respiratory distress.     Breath sounds: Normal breath sounds. No wheezing.  Abdominal:     General: Bowel sounds are normal. There is no distension.     Palpations: Abdomen is soft.     Tenderness: There is no abdominal tenderness.  Musculoskeletal:        General: Normal range of motion.     Cervical back: Normal range of motion and neck supple.  Skin:    General: Skin is warm and dry.  Neurological:     General: No focal deficit present.     Mental  Status: She is alert and oriented to person, place, and time.     ED Results / Procedures / Treatments   Labs (all labs ordered are listed, but only abnormal results are displayed) Labs Reviewed  CBC WITH DIFFERENTIAL/PLATELET  URINALYSIS, ROUTINE W REFLEX MICROSCOPIC  COMPREHENSIVE METABOLIC PANEL  LIPASE, BLOOD  TROPONIN I (HIGH SENSITIVITY)    EKG None  Radiology No results found.  Procedures Procedures  {Document cardiac monitor, telemetry assessment procedure when appropriate:1}  Medications Ordered in ED Medications  sodium chloride 0.9 % bolus 500 mL (has no administration in time range)    ED Course/ Medical Decision Making/ A&P   {   Click here for ABCD2, HEART and other calculatorsREFRESH Note before signing :1}                          Medical Decision Making Amount and/or Complexity of Data Reviewed Labs: ordered.   ***  {Document critical care time when appropriate:1} {Document review of labs and clinical decision tools ie heart score, Chads2Vasc2 etc:1}  {Document your independent review of radiology images, and any outside records:1} {Document your discussion with family members, caretakers, and with consultants:1} {Document social determinants of health affecting pt's care:1} {Document your decision making why or why not admission, treatments were needed:1} Final Clinical Impression(s) / ED Diagnoses Final diagnoses:  None    Rx / DC Orders ED Discharge Orders     None

## 2023-01-19 NOTE — H&P (Addendum)
History and Physical    [name] ZOX:[id number] DOB: [birth year] DOA: 01/19/2023  PCP: Elfredia Nevins, MD   Patient coming from: Home  Chief Complaint: Weakness/Confusion  HPI: [name] is a 82 y.o. female with medical history significant forhypertension and recurrent kidney stones who presented to the ED due to complaints of weakness and malaise after recent admission for similar issues in the setting of uncontrolled hypertension.  She is now having extreme weakness and cannot ambulate.  Granddaughter at bedside states that she has not been eating or drinking very much and has been more confused as of late.  She is adamant that she has been taking her medications as otherwise scheduled and typically has a blood pressure of approximately 180/70 at home and sometimes notices some acute dips in her blood pressure.  Patient states that she just oftentimes does not feel like eating very much and denies any current nausea or vomiting.   ED Course: Vital signs with elevated blood pressures noted and she has been given multiple medications with improved control.  Brain MRI and chest x-ray with no acute findings.  Laboratory data unremarkable.  Review of Systems: Reviewed as noted above, otherwise negative.  Past Medical History:  Diagnosis Date   History of kidney stones    Hypertension     Past Surgical History:  Procedure Laterality Date   CATARACT EXTRACTION W/PHACO Left 05/02/2019   Procedure: CATARACT EXTRACTION PHACO AND INTRAOCULAR LENS PLACEMENT (IOC);  Surgeon: Fabio Pierce, MD;  Location: [facility];  Service: Ophthalmology;  Laterality: Left;  CDE: 27.19   CATARACT EXTRACTION W/PHACO Right 05/16/2019   Procedure: CATARACT EXTRACTION PHACO AND INTRAOCULAR LENS PLACEMENT RIGHT EYE;  Surgeon: Fabio Pierce, MD;  Location: [facility];  Service: Ophthalmology;  Laterality: Right;  CDE: 18.89   CESAREAN SECTION       reports that she has quit smoking. She has been exposed to  tobacco smoke. She has never used smokeless tobacco. She reports that she does not drink alcohol and does not use drugs.  No Known Allergies  History reviewed. No pertinent family history.  Prior to Admission medications   Medication Sig Start Date End Date Taking? Authorizing Provider  acetaminophen (TYLENOL) 500 MG tablet Take 1,000 mg by mouth daily as needed for mild pain.    [provider]  ALPRAZolam Prudy Feeler) 0.25 MG tablet Take 0.25 mg by mouth daily as needed for anxiety.    [provider]  atorvastatin (LIPITOR) 10 MG tablet Take 10 mg by mouth daily.     [provider]  carvedilol (COREG) 3.125 MG tablet Take 3.125 mg by mouth 2 (two) times daily with a meal.    [provider]  Cyanocobalamin (B-12 COMPLIANCE INJECTION IJ) Inject as directed. One monthly    [provider]  feeding supplement, ENSURE ENLIVE, (ENSURE ENLIVE) LIQD Take 237 mLs by mouth 2 (two) times daily between meals. Patient not taking: Reported on 12/30/2022 04/16/20   Shon Hale, MD  guaiFENesin-dextromethorphan (ROBITUSSIN DM) 100-10 MG/5ML syrup Take 10 mLs by mouth every 4 (four) hours as needed for cough. Patient not taking: Reported on 01/02/2023 04/16/20   Shon Hale, MD  hydrALAZINE (APRESOLINE) 50 MG tablet Take 50 mg by mouth 3 (three) times daily. 03/03/19   [provider]  LORazepam (ATIVAN) 0.5 MG tablet Take 1/2 tablet 1 hour prior to MRI, can take second half 30 minutes prior if anxiety still present Patient not taking: Reported on 01/02/2023 12/30/22  Carlan, Chelsea L, NP  losartan (COZAAR) 100 MG tablet Take 1 tablet (100 mg total) by mouth daily. 04/16/20   Shon Hale, MD  ondansetron (ZOFRAN) 4 MG tablet Take 1 tablet (4 mg total) by mouth daily as needed for nausea or vomiting. 01/02/23 01/02/24  Sherryll Burger, Dafney Farler D, DO  ondansetron (ZOFRAN-ODT) 8 MG disintegrating tablet Take 8 mg by mouth every 8 (eight) hours as needed for nausea.  12/18/22   [provider]  zinc sulfate 220 (50 Zn) MG capsule Take 1 capsule (220 mg total) by mouth daily. Patient not taking: Reported on 01/02/2023 04/17/20   Shon Hale, MD    Physical Exam: Vitals:   01/19/23 1330 01/19/23 1345 01/19/23 1400 01/19/23 1415  BP: (!) 181/90 (!) 168/125 (!) 178/84 (!) 187/94  Pulse: 90 95 89 96  Resp: 16 16 20  (!) 24  Temp:      TempSrc:      SpO2: 96% 94% 95% 96%  Weight:        Constitutional: NAD, calm, comfortable, thin/frail Vitals:   01/19/23 1330 01/19/23 1345 01/19/23 1400 01/19/23 1415  BP: (!) 181/90 (!) 168/125 (!) 178/84 (!) 187/94  Pulse: 90 95 89 96  Resp: 16 16 20  (!) 24  Temp:      TempSrc:      SpO2: 96% 94% 95% 96%  Weight:       Eyes: lids and conjunctivae normal Neck: normal, supple Respiratory: clear to auscultation bilaterally. Normal respiratory effort. No accessory muscle use.  Cardiovascular: Regular rate and rhythm, no murmurs. Abdomen: no tenderness, no distention. Bowel sounds positive.  Musculoskeletal:  No edema. Skin: no rashes, lesions, ulcers.  Psychiatric: Flat affect  Labs on Admission: I have personally reviewed following labs and imaging studies  CBC: Recent Labs  Lab 01/19/23 0333  WBC 7.1  NEUTROABS 5.1  HGB 13.6  HCT 42.2  MCV 91.3  PLT 282   Basic Metabolic Panel: Recent Labs  Lab 01/19/23 0333  NA 140  K 3.5  CL 104  CO2 27  GLUCOSE 128*  BUN 17  CREATININE 0.83  CALCIUM 9.9   GFR: Estimated Creatinine Clearance: 45.3 mL/min (by C-G formula based on SCr of 0.83 mg/dL). Liver Function Tests: Recent Labs  Lab 01/19/23 0333  AST 21  ALT 28  ALKPHOS 126  BILITOT 1.2  PROT 7.1  ALBUMIN 3.7   Recent Labs  Lab 01/19/23 0333  LIPASE 48   No results for input(s): "AMMONIA" in the last 168 hours. Coagulation Profile: No results for input(s): "INR", "PROTIME" in the last 168 hours. Cardiac Enzymes: No results for input(s): "CKTOTAL", "CKMB", "CKMBINDEX",  "TROPONINI" in the last 168 hours. BNP (last 3 results) No results for input(s): "PROBNP" in the last 8760 hours. HbA1C: No results for input(s): "HGBA1C" in the last 72 hours. CBG: No results for input(s): "GLUCAP" in the last 168 hours. Lipid Profile: No results for input(s): "CHOL", "HDL", "LDLCALC", "TRIG", "CHOLHDL", "LDLDIRECT" in the last 72 hours. Thyroid Function Tests: No results for input(s): "TSH", "T4TOTAL", "FREET4", "T3FREE", "THYROIDAB" in the last 72 hours. Anemia Panel: No results for input(s): "VITAMINB12", "FOLATE", "FERRITIN", "TIBC", "IRON", "RETICCTPCT" in the last 72 hours. Urine analysis:    Component Value Date/Time   COLORURINE YELLOW 01/19/2023 1320   APPEARANCEUR HAZY (A) 01/19/2023 1320   LABSPEC 1.012 01/19/2023 1320   PHURINE 7.0 01/19/2023 1320   GLUCOSEU NEGATIVE 01/19/2023 1320   HGBUR SMALL (A) 01/19/2023 1320   BILIRUBINUR NEGATIVE 01/19/2023 1320  KETONESUR 5 (A) 01/19/2023 1320   PROTEINUR 100 (A) 01/19/2023 1320   UROBILINOGEN 0.2 04/14/2008 1005   NITRITE NEGATIVE 01/19/2023 1320   LEUKOCYTESUR NEGATIVE 01/19/2023 1320    Radiological Exams on Admission: DG Chest 1 View  Result Date: 01/19/2023 CLINICAL DATA:  Weakness EXAM: CHEST  1 VIEW COMPARISON:  12/26/2022 x-ray FINDINGS: Hyperinflation. No consolidation, pneumothorax or effusion. No edema. Normal cardiopericardial silhouette. Overlapping cardiac leads. IMPRESSION: Hyperinflation.  No acute cardiopulmonary disease Electronically Signed   By: Karen Kays M.D.   On: 01/19/2023 13:53   MR BRAIN WO CONTRAST  Result Date: 01/19/2023 CLINICAL DATA:  82 year old female with neurologic deficit. Generalized weakness, lethargy. EXAM: MRI HEAD WITHOUT CONTRAST TECHNIQUE: Multiplanar, multiecho pulse sequences of the brain and surrounding structures were obtained without intravenous contrast. COMPARISON:  Head CTs 01/01/2023 and earlier. FINDINGS: Brain: No restricted diffusion to suggest acute  infarction. No midline shift, mass effect, evidence of mass lesion, ventriculomegaly, extra-axial collection or acute intracranial hemorrhage. Cervicomedullary junction and pituitary are within normal limits. In general cerebral volume appears normal for age. However, there does appear to be focal chronic encephalomalacia of the corpus callosum body (on series 7, image 12) and there is confluent bilateral cerebral white matter T2 and FLAIR hyperintensity. 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 Comparatively mild T2 heterogeneity in the pons. No definite cortical encephalomalacia, or chronic cerebral blood products (T2 * imaging). Vascular: Major intracranial vascular flow voids are preserved. Skull and upper cervical spine: Degenerative ligamentous hypertrophy about the odontoid but otherwise negative for age visible cervical spine. Visualized bone marrow signal is within normal limits. Sinuses/Orbits: Postoperative changes to both globes. Occasional paranasal sinus mucosal thickening and retention cysts. Other: Mastoids are clear. Visible internal auditory structures appear normal. Negative visible scalp and face. IMPRESSION: 1. No acute intracranial abnormality. 2. 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 Electronically Signed   By: Odessa Fleming M.D.   On: 01/19/2023 08:19    EKG: Independently reviewed. SR 87bpm.  Assessment/Plan Principal Problem:   Generalized weakness Active Problems:   Essential hypertension   Splenic lesion   Pancreatic lesion   Loss of weight   Generalized weakness -Likely in the setting of failure to thrive in advancing dementia -Protein shakes and dietary evaluation -Appreciate palliative consultation for goals of care -PT/OT evaluation and fall precautions   Uncontrolled hypertension -Appears  to have a high physiologic set point of blood pressure near 180 systolic -Titrate medications to allow for higher blood pressure readings -It appears that tight control might be causing worsening symptoms of weakness   Abdominal mass -Being followed by GI outpatient with MRI ordered, but will obtain inpatient given current symptoms, premedicate with Ativan -No need for further inpatient evaluation   Anxiety disorder -Takes Xanax as needed  DVT prophylaxis: Lovenox Code Status: Full Family Communication: Granddaughter at bedside 7/1 Disposition Plan:Admit for weakness Consults called: Palliative care Admission status: Obs, Tele  Severity of Illness: The appropriate patient status for this patient is OBSERVATION. Observation status is judged to be reasonable and necessary in order to provide the required intensity of service to ensure the patient's safety. The patient's presenting symptoms, physical exam findings, and initial radiographic and laboratory data in the context of their medical condition is felt to place them at decreased risk for further clinical deterioration. Furthermore, it is  anticipated that the patient will be medically stable for discharge from the hospital within 2 midnights of admission.    Kimber Esterly D Sherryll Burger DO [facility]  If 7PM-7AM, please contact night-coverage [email or web address]  01/19/2023, 2:50 PM

## 2023-01-19 NOTE — ED Triage Notes (Signed)
Pt in from home via [facility] with generalized weakness, lethargy and HTN. Pt states she woke up tonight and felt weak, got her husband to call EMS. HTN en route 214/110, EMS also states pt was initially c/o abdominal pain. Pt lethargic on arrival, oriented x 4, warm to touch. CBG 128 en route

## 2023-01-19 NOTE — ED Notes (Signed)
Pt returned from MRI °

## 2023-01-19 NOTE — ED Notes (Signed)
Patient transported to MRI 

## 2023-01-20 DIAGNOSIS — Z7189 Other specified counseling: Secondary | ICD-10-CM | POA: Diagnosis not present

## 2023-01-20 DIAGNOSIS — Z515 Encounter for palliative care: Secondary | ICD-10-CM

## 2023-01-20 DIAGNOSIS — R531 Weakness: Secondary | ICD-10-CM | POA: Diagnosis not present

## 2023-01-20 DIAGNOSIS — R634 Abnormal weight loss: Secondary | ICD-10-CM | POA: Diagnosis not present

## 2023-01-20 LAB — CBC
HCT: 37.5 % (ref 36.0–46.0)
Hemoglobin: 12.3 g/dL (ref 12.0–15.0)
MCH: 29.2 pg (ref 26.0–34.0)
MCHC: 32.8 g/dL (ref 30.0–36.0)
MCV: 89.1 fL (ref 80.0–100.0)
Platelets: 257 10*3/uL (ref 150–400)
RBC: 4.21 MIL/uL (ref 3.87–5.11)
RDW: 14.4 % (ref 11.5–15.5)
WBC: 6.3 10*3/uL (ref 4.0–10.5)
nRBC: 0 % (ref 0.0–0.2)

## 2023-01-20 LAB — BASIC METABOLIC PANEL
Anion gap: 7 (ref 5–15)
BUN: 19 mg/dL (ref 8–23)
CO2: 28 mmol/L (ref 22–32)
Calcium: 9.6 mg/dL (ref 8.9–10.3)
Chloride: 102 mmol/L (ref 98–111)
Creatinine, Ser: 0.97 mg/dL (ref 0.44–1.00)
GFR, Estimated: 58 mL/min — ABNORMAL LOW (ref >60)
Glucose, Bld: 99 mg/dL (ref 70–99)
Potassium: 3.2 mmol/L — ABNORMAL LOW (ref 3.5–5.1)
Sodium: 137 mmol/L (ref 135–145)

## 2023-01-20 LAB — MAGNESIUM: Magnesium: 1.9 mg/dL (ref 1.7–2.4)

## 2023-01-20 MED ORDER — POTASSIUM CHLORIDE CRYS ER 20 MEQ PO TBCR
40.0000 meq | EXTENDED_RELEASE_TABLET | Freq: Once | ORAL | Status: AC
  Administered 2023-01-20: 40 meq via ORAL
  Filled 2023-01-20: qty 2

## 2023-01-20 NOTE — Plan of Care (Signed)
  Problem: Acute Rehab PT Goals(only PT should resolve) Goal: Pt Will Go Supine/Side To Sit Outcome: Progressing Flowsheets (Taken 01/20/2023 1210) Pt will go Supine/Side to Sit:  Independently  with modified independence Goal: Patient Will Transfer Sit To/From Stand Outcome: Progressing Flowsheets (Taken 01/20/2023 1210) Patient will transfer sit to/from stand:  with modified independence  with supervision Goal: Pt Will Transfer Bed To Chair/Chair To Bed Outcome: Progressing Flowsheets (Taken 01/20/2023 1210) Pt will Transfer Bed to Chair/Chair to Bed:  with modified independence  with supervision Goal: Pt Will Ambulate Outcome: Progressing Flowsheets (Taken 01/20/2023 1210) Pt will Ambulate:  100 feet  with modified independence  with supervision  with rolling walker   12:11 PM, 01/20/23 Ocie Bob, MPT Physical Therapist with [facility] [phone] office [phone] mobile phone

## 2023-01-20 NOTE — Consult Note (Addendum)
Consultation Note Date: 01/20/2023   Patient Name: [name]  DOB: 1940-11-28  MRN: [id number]  Age / Sex: 82 y.o., female  PCP: Elfredia Nevins, MD Referring Physician: Erick Blinks, DO  Reason for Consultation: Establishing goals of care  HPI/Patient Profile: 82 y.o. female  with past medical history of hypertension, recurrent kidney stones, anxiety admitted on 01/19/2023 with weakness and confusion with poor intake. Plans for MRI to evaluate abdominal mass. Concern for underlying dementia leading to failure to thrive.   Clinical Assessment and Goals of Care: Consult received and chart review completed. Discussed with Dr. Sherryll Burger. I met today with [name] and granddaughter, Delbert Harness, at bedside. [name] is sitting on the side of the bed and tells me that she is feeling better and hoping to go home today. She reports that she ate a better breakfast. We spent time discussing her overall health and recent changes and decline. She has colonoscopy planned for 7/19 they tell me. [name] shares that she is more motivated to try and eat more and gain some strength. We discussed using supplements between meals to provide more calories and energy.   We reviewed goals of care and hopes for the future. [name] is hoping to return home and improve her intake and strength. They ask about HCPOA so we reviewed Advance Directive. [name] is clear about her wishes. She desires DNR. She wishes for granddaughter, Delbert Harness, to be her primary HCPOA with son, Caryn Bee, as secondary. She does NOT want her life prolonged in the 3 scenarios and does NOT want a feeding tube in those scenarios. She tells me that when her time comes it is meant to be and she understands that dying is part of life. Delbert Harness is supportive of her decisions stated today.   [name] shares that she has a husband of 56 years, 3 adult children, and  raised Delbert Harness as her own daughter as well. Delbert Harness is her main medical support and caregiver. We discussed the importance of having these discussions with her husband as well so they know his wishes too.   All questions/concerns addressed. Emotional support provided.   Primary Decision Maker PATIENT    SUMMARY OF RECOMMENDATIONS   - DNR decided - Hopeful for improvement  Code Status/Advance Care Planning: DNR   Symptom Management:  Per attending, f/u with GI  Prognosis:  Overall long term prognosis poor.   Discharge Planning: Home with Home Health      Primary Diagnoses: Present on Admission:  Essential hypertension  Loss of weight  Pancreatic lesion  Splenic lesion   I have reviewed the medical record, interviewed the patient and family, and examined the patient. The following aspects are pertinent.  Past Medical History:  Diagnosis Date   History of kidney stones    Hypertension    Social History   Socioeconomic History   Marital status: Married    Spouse name: Not on file   Number of children: Not on file   Years of education: Not  on file   Highest education level: Not on file  Occupational History   Not on file  Tobacco Use   Smoking status: Former    Passive exposure: Past   Smokeless tobacco: Never  Vaping Use   Vaping Use: Not on file  Substance and Sexual Activity   Alcohol use: No   Drug use: Never   Sexual activity: Not on file  Other Topics Concern   Not on file  Social History Narrative   Not on file   Social Determinants of Health   Financial Resource Strain: Not on file  Food Insecurity: No Food Insecurity (01/19/2023)   Hunger Vital Sign    Worried About Running Out of Food in the Last Year: Never true    Ran Out of Food in the Last Year: Never true  Transportation Needs: No Transportation Needs (01/19/2023)   PRAPARE - Administrator, Civil Service (Medical): No    Lack of Transportation (Non-Medical): No  Physical  Activity: Not on file  Stress: Not on file  Social Connections: Not on file   History reviewed. No pertinent family history. Scheduled Meds:  atorvastatin  10 mg Oral Daily   carvedilol  3.125 mg Oral BID WC   enoxaparin (LOVENOX) injection  40 mg Subcutaneous Q24H   feeding supplement  237 mL Oral BID BM   hydrALAZINE  50 mg Oral TID   losartan  100 mg Oral Daily   sodium chloride flush  3 mL Intravenous Q12H   Continuous Infusions:  sodium chloride     PRN Meds:.sodium chloride, acetaminophen **OR** acetaminophen, ALPRAZolam, hydrALAZINE, ondansetron **OR** ondansetron (ZOFRAN) IV, sodium chloride flush No Known Allergies Review of Systems  Constitutional:  Positive for activity change, appetite change and fatigue.  Gastrointestinal:  Negative for abdominal pain.  Neurological:  Positive for weakness.    Physical Exam Vitals and nursing note reviewed.  Constitutional:      Appearance: She is cachectic. She is ill-appearing.  Cardiovascular:     Rate and Rhythm: Normal rate.  Pulmonary:     Effort: No tachypnea, accessory muscle usage or respiratory distress.  Abdominal:     Palpations: Abdomen is soft.  Neurological:     Mental Status: She is alert and oriented to person, place, and time.     Comments: Forgetful at times but fully comprehending conversation and thoughtful about her decisions. Full capacity to make her own decisions at time of my visit and discussion.      Vital Signs: BP 112/66 (BP Location: Left Arm)   Pulse 78   Temp 97.8 F (36.6 C)   Resp 20   Wt 55.9 kg   SpO2 95%   BMI 17.68 kg/m  Pain Scale: 0-10   Pain Score: 7    SpO2: SpO2: 95 % O2 Device:SpO2: 95 % O2 Flow Rate: .   IO: Intake/output summary:  Intake/Output Summary (Last 24 hours) at 01/20/2023 1009 Last data filed at 01/20/2023 0500 Gross per 24 hour  Intake 600 ml  Output 1200 ml  Net -600 ml    LBM:   Baseline Weight: Weight: 55.9 kg Most recent weight: Weight: 55.9 kg      Palliative Assessment/Data:      Time Total: 85 min  Greater than 50%  of this time was spent counseling and coordinating care related to the above assessment and plan.  Signed by: Yong Channel, NP Palliative Medicine Team Pager # [phone] (M-F 8a-5p) Team Phone # [phone] (Nights/Weekends)

## 2023-01-20 NOTE — Care Management Obs Status (Signed)
MEDICARE OBSERVATION STATUS NOTIFICATION   Patient Details  Name: [name] MRN: [id number] Date of Birth: Dec 30, 1940   Medicare Observation Status Notification Given:  No (patient discharged)    Corey Harold 01/20/2023, 2:58 PM

## 2023-01-20 NOTE — Discharge Summary (Signed)
Physician Discharge Summary  [name] ZOX:[id number] DOB: 1940-10-08 DOA: 01/19/2023  PCP: Courtney Nevins, MD  Admit date: 01/19/2023  Discharge date: 01/20/2023  Admitted From:Home  Disposition:  Home  Recommendations for Outpatient Follow-up:  Follow up with PCP in 1-2 weeks Follow-up with neurology outpatient with referral sent for evaluation and management of dementia Continue other home medications as prior and will not aggressively control blood pressure due to poor oral intake-continue similar dosing of medications as prior.  Favor some blood pressure elevations to prevent further symptomatology.  Home Health: Yes with physical therapy  Equipment/Devices: None  Discharge Condition:Stable  CODE STATUS: DNR  Diet recommendation: Heart Healthy  Brief/Interim Summary: [name] is a 82 y.o. female with medical history significant forhypertension and recurrent kidney stones who presented to the ED due to complaints of weakness and malaise after recent admission for similar issues in the setting of uncontrolled hypertension.  She is now having extreme weakness and cannot ambulate.  Granddaughter at bedside states that she has not been eating or drinking very much and has been more confused as of late.  She is adamant that she has been taking her medications as otherwise scheduled and typically has a blood pressure of approximately 180/70 at home and sometimes notices some acute dips in her blood pressure.  Patient states that she just oftentimes does not feel like eating very much and denies any current nausea or vomiting.   She was admitted for evaluation of generalized weakness in the setting of failure to thrive as well as some uncontrolled hypertension.  She appears to have some progression of her dementia and was seen by palliative care and is now DNR and will be referred to outpatient palliative evaluation.  Patient and granddaughter are concerned about dementia and would  like referral to neurology which has been arranged for further evaluation and management.  It appears that her long-term prognosis is poor as she continues to have weight loss with poor appetite.  She continues to remain at high risk for readmissions.  No other acute events or concerns noted throughout the course of the stay.  Discharge Diagnoses:  Principal Problem:   Generalized weakness Active Problems:   Essential hypertension   Splenic lesion   Pancreatic lesion   Loss of weight  Principal discharge diagnosis: Generalized weakness in the setting of failure to thrive with advancing dementia and labile hypertension.  Discharge Instructions  Discharge Instructions     Ambulatory referral to Neurology   Complete by: As directed    An appointment is requested in approximately: 1 week   Diet - low sodium heart healthy   Complete by: As directed    Increase activity slowly   Complete by: As directed       Allergies as of 01/20/2023   No Known Allergies      Medication List     TAKE these medications    acetaminophen 500 MG tablet Commonly known as: TYLENOL Take 1,000 mg by mouth daily as needed for mild pain.   ALPRAZolam 0.25 MG tablet Commonly known as: XANAX Take 0.25 mg by mouth 2 (two) times daily as needed for anxiety.   atorvastatin 10 MG tablet Commonly known as: LIPITOR Take 10 mg by mouth daily.   B-12 COMPLIANCE INJECTION IJ Inject as directed. One monthly   carvedilol 3.125 MG tablet Commonly known as: COREG Take 3.125 mg by mouth 2 (two) times daily with a meal.   feeding supplement Liqd Take 237  mLs by mouth 2 (two) times daily between meals.   guaiFENesin-dextromethorphan 100-10 MG/5ML syrup Commonly known as: ROBITUSSIN DM Take 10 mLs by mouth every 4 (four) hours as needed for cough.   hydrALAZINE 50 MG tablet Commonly known as: APRESOLINE Take 50 mg by mouth 3 (three) times daily.   LORazepam 0.5 MG tablet Commonly known as:  Ativan Take 1/2 tablet 1 hour prior to MRI, can take second half 30 minutes prior if anxiety still present   losartan 100 MG tablet Commonly known as: COZAAR Take 1 tablet (100 mg total) by mouth daily.   ondansetron 4 MG tablet Commonly known as: Zofran Take 1 tablet (4 mg total) by mouth daily as needed for nausea or vomiting.   zinc sulfate 220 (50 Zn) MG capsule Take 1 capsule (220 mg total) by mouth daily.        Follow-up Information     [facility] Follow up.   Why: [facility] staff will call you to schedule in home PT visits        Courtney Nevins, MD. Schedule an appointment as soon as possible for a visit in 1 week(s).   Specialty: Internal Medicine Contact information: [address] [place] [address] [phone]                No Known Allergies  Consultations: Palliative care   Procedures/Studies: DG Chest 1 View  Result Date: 01/19/2023 CLINICAL DATA:  Weakness EXAM: CHEST  1 VIEW COMPARISON:  12/26/2022 x-ray FINDINGS: Hyperinflation. No consolidation, pneumothorax or effusion. No edema. Normal cardiopericardial silhouette. Overlapping cardiac leads. IMPRESSION: Hyperinflation.  No acute cardiopulmonary disease Electronically Signed   By: Karen Kays M.D.   On: 01/19/2023 13:53   MR BRAIN WO CONTRAST  Result Date: 01/19/2023 CLINICAL DATA:  82 year old female with neurologic deficit. Generalized weakness, lethargy. EXAM: MRI HEAD WITHOUT CONTRAST TECHNIQUE: Multiplanar, multiecho pulse sequences of the brain and surrounding structures were obtained without intravenous contrast. COMPARISON:  Head CTs 01/01/2023 and earlier. FINDINGS: Brain: No restricted diffusion to suggest acute infarction. No midline shift, mass effect, evidence of mass lesion, ventriculomegaly, extra-axial collection or acute intracranial hemorrhage. Cervicomedullary junction and pituitary are within normal limits. In general cerebral volume appears  normal for age. However, there does appear to be focal chronic encephalomalacia of the corpus callosum body (on series 7, image 12) and there is confluent bilateral cerebral white matter T2 and FLAIR hyperintensity. 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 Comparatively mild T2 heterogeneity in the pons. No definite cortical encephalomalacia, or chronic cerebral blood products (T2 * imaging). Vascular: Major intracranial vascular flow voids are preserved. Skull and upper cervical spine: Degenerative ligamentous hypertrophy about the odontoid but otherwise negative for age visible cervical spine. Visualized bone marrow signal is within normal limits. Sinuses/Orbits: Postoperative changes to both globes. Occasional paranasal sinus mucosal thickening and retention cysts. Other: Mastoids are clear. Visible internal auditory structures appear normal. Negative visible scalp and face. IMPRESSION: 1. No acute intracranial abnormality. 2. 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 Electronically Signed   By: Odessa Fleming M.D.   On: 01/19/2023 08:19   MR ABDOMEN W WO CONTRAST  Result Date: 01/01/2023 CLINICAL DATA:  82 year old female history of abdominal mass. Follow-up study. EXAM: MRI ABDOMEN WITHOUT AND WITH CONTRAST (INCLUDING MRCP) TECHNIQUE: Multiplanar multisequence MR imaging  of the abdomen was performed both before and after the administration of intravenous contrast. Heavily T2-weighted images of the biliary and pancreatic ducts were obtained, and three-dimensional MRCP images were rendered by post processing. CONTRAST:  5mL GADAVIST GADOBUTROL 1 MMOL/ML IV SOLN COMPARISON:  CT of the abdomen and pelvis 12/26/2022. No prior abdominal MRI. FINDINGS: Comment: Today's study is limited by extensive patient respiratory motion. Lower chest:  Unremarkable. Hepatobiliary: No suspicious cystic or solid hepatic lesions. Gallbladder is only mildly distended. In the fundus of the gallbladder there is a dependent 1.4 cm filling defect, compatible with a gallstone. No gallbladder wall thickening, pericholecystic fluid or surrounding inflammatory changes. No intra or extrahepatic biliary ductal dilatation noted on MRCP images. Common bile duct measures 6 mm in the porta hepatis. Pancreas: 5 mm T1 hypointense, T2 hyperintense nonenhancing lesion in the head of the pancreas (axial image 22 of series 4), does not appear to communicate with the main pancreatic duct. No main pancreatic ductal dilatation. No other suspicious solid enhancing pancreatic neoplasm confidently identified. No peripancreatic fluid collections or inflammatory changes. Spleen: There is a round area in the medial aspect of the spleen estimated to measure approximately 3.0 x 2.9 cm (axial image 14 of series 15) which is isointense to spleen on T1 and T2 weighted images, and demonstrates avid arterial phase hyperenhancement with less intense signal intensity on delayed images which is similar to surrounding splenic parenchyma, most compatible with a benign splenic lesion, presumably a splenic hamartoma. Adrenals/Urinary Tract: Multiple T1 hypointense, T2 hyperintense, nonenhancing lesions are noted in the kidneys bilaterally, the majority of which are simple in appearance and compatible with simple (Bosniak class 1) cysts which require no imaging follow-up. The exception to this is a lesion in the interpolar region of the left kidney (axial image 15 of series 4 and coronal image 17 of series 3) estimated to measure approximately 5.1 x 5.0 x 3.6 cm, which has multiple (greater than 4) thin internal septations, but no definite enhancing internal soft tissue or mural nodularity, compatible with a Bosniak class 64F cyst. No other definite suspicious appearing renal lesions. No hydroureteronephrosis in  the visualized portions of the abdomen. Bilateral adrenal glands are normal in appearance. Stomach/Bowel: Loops of small bowel in the right-side of the abdomen and central pelvis which appears thickened and mildly dilated measuring up to 3.5 cm in diameter. Several colonic diverticuli are noted. Vascular/Lymphatic: No aneurysm identified in the visualized abdominal vasculature. No lymphadenopathy noted in the abdomen. Other: Trace amount of fluid adjacent to thickened small bowel loops in the right-side of the abdomen. New fluid in the left pericolic gutter. No significant volume of ascites noted in the visualized portions of the peritoneal cavity. Musculoskeletal: No aggressive appearing lytic or blastic lesions are noted in the visualized portions of the skeleton. IMPRESSION: 1. 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 No imaging follow-up is recommended. 2. 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 Given the patient's advanced age, repeat abdominal MRI with and without IV gadolinium with MRCP in 2 years is recommended to ensure the stability of this finding. 3. Bosniak class 64F cyst in the interpolar region of the left kidney. Repeat abdominal MRI with and without IV gadolinium is recommended in 6 months to ensure the stability of this finding. 4. Interval development of dilated loops of  small bowel which appears thickened, in addition to new small volume of ascites adjacent to the thickened small bowel loops and in the left pericolic gutter. Findings are poorly evaluated by MRI. Repeat contrast-enhanced CT of the abdomen and pelvis should be considered, particularly if there is any clinical concern for bowel obstruction or enteritis. 5. Cholelithiasis without evidence of acute cholecystitis. 6. Colonic  diverticulosis. Electronically Signed   By: Trudie Reed M.D.   On: 01/01/2023 15:09   MR 3D Recon At Scanner  Result Date: 01/01/2023 CLINICAL DATA:  82 year old female history of abdominal mass. Follow-up study. EXAM: MRI ABDOMEN WITHOUT AND WITH CONTRAST (INCLUDING MRCP) TECHNIQUE: Multiplanar multisequence MR imaging of the abdomen was performed both before and after the administration of intravenous contrast. Heavily T2-weighted images of the biliary and pancreatic ducts were obtained, and three-dimensional MRCP images were rendered by post processing. CONTRAST:  5mL GADAVIST GADOBUTROL 1 MMOL/ML IV SOLN COMPARISON:  CT of the abdomen and pelvis 12/26/2022. No prior abdominal MRI. FINDINGS: Comment: Today's study is limited by extensive patient respiratory motion. Lower chest: Unremarkable. Hepatobiliary: No suspicious cystic or solid hepatic lesions. Gallbladder is only mildly distended. In the fundus of the gallbladder there is a dependent 1.4 cm filling defect, compatible with a gallstone. No gallbladder wall thickening, pericholecystic fluid or surrounding inflammatory changes. No intra or extrahepatic biliary ductal dilatation noted on MRCP images. Common bile duct measures 6 mm in the porta hepatis. Pancreas: 5 mm T1 hypointense, T2 hyperintense nonenhancing lesion in the head of the pancreas (axial image 22 of series 4), does not appear to communicate with the main pancreatic duct. No main pancreatic ductal dilatation. No other suspicious solid enhancing pancreatic neoplasm confidently identified. No peripancreatic fluid collections or inflammatory changes. Spleen: There is a round area in the medial aspect of the spleen estimated to measure approximately 3.0 x 2.9 cm (axial image 14 of series 15) which is isointense to spleen on T1 and T2 weighted images, and demonstrates avid arterial phase hyperenhancement with less intense signal intensity on delayed images which is similar to surrounding  splenic parenchyma, most compatible with a benign splenic lesion, presumably a splenic hamartoma. Adrenals/Urinary Tract: Multiple T1 hypointense, T2 hyperintense, nonenhancing lesions are noted in the kidneys bilaterally, the majority of which are simple in appearance and compatible with simple (Bosniak class 1) cysts which require no imaging follow-up. The exception to this is a lesion in the interpolar region of the left kidney (axial image 15 of series 4 and coronal image 17 of series 3) estimated to measure approximately 5.1 x 5.0 x 3.6 cm, which has multiple (greater than 4) thin internal septations, but no definite enhancing internal soft tissue or mural nodularity, compatible with a Bosniak class 82F cyst. No other definite suspicious appearing renal lesions. No hydroureteronephrosis in the visualized portions of the abdomen. Bilateral adrenal glands are normal in appearance. Stomach/Bowel: Loops of small bowel in the right-side of the abdomen and central pelvis which appears thickened and mildly dilated measuring up to 3.5 cm in diameter. Several colonic diverticuli are noted. Vascular/Lymphatic: No aneurysm identified in the visualized abdominal vasculature. No lymphadenopathy noted in the abdomen. Other: Trace amount of fluid adjacent to thickened small bowel loops in the right-side of the abdomen. New fluid in the left pericolic gutter. No significant volume of ascites noted in the visualized portions of the peritoneal cavity. Musculoskeletal: No aggressive appearing lytic or blastic lesions are noted in the visualized portions of the skeleton. IMPRESSION: 1. 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 No imaging follow-up is recommended. 2. 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 Given the patient's  advanced age, repeat abdominal MRI with and without IV gadolinium with MRCP in 2 years is recommended to ensure the stability of this finding. 3. Bosniak class 35F cyst in the interpolar region of the left kidney. Repeat abdominal MRI with and without IV gadolinium is recommended in 6 months to ensure the stability of this finding. 4. Interval development of dilated loops of small bowel which appears thickened, in addition to new small volume of ascites adjacent to the thickened small bowel loops and in the left pericolic gutter. Findings are poorly evaluated by MRI. Repeat contrast-enhanced CT of the abdomen and pelvis should be considered, particularly if there is any clinical concern for bowel obstruction or enteritis. 5. Cholelithiasis without evidence of acute cholecystitis. 6. Colonic diverticulosis. Electronically Signed   By: Trudie Reed M.D.   On: 01/01/2023 15:09   CT HEAD WO CONTRAST ( )  Result Date: 01/01/2023 CLINICAL DATA:  82 year old female with weakness, newly unable to ambulate, delirium. EXAM: CT HEAD WITHOUT CONTRAST TECHNIQUE: Contiguous axial images were obtained from the base of the skull through the vertex without intravenous contrast. RADIATION DOSE REDUCTION: This exam was performed according to the departmental dose-optimization program which includes automated exposure control, adjustment of the mA and/or kV according to patient size and/or use of iterative reconstruction technique. COMPARISON:  Head CT 12/26/2022. FINDINGS: Brain: Chronic small vessel disease with confluent bilateral cerebral white matter hypodensity, chronic heterogeneity in the deep gray nuclei especially the right thalamus. Stable cerebral volume. No midline shift, ventriculomegaly, mass effect, evidence of mass lesion, intracranial hemorrhage or evidence of cortically based acute infarction. Vascular: Calcified atherosclerosis at the skull base. No suspicious intracranial vascular hyperdensity. Skull: No  acute osseous abnormality identified. Sinuses/Orbits: Visualized paranasal sinuses and mastoids are stable and well aerated. Other: No acute orbit or scalp soft tissue finding. IMPRESSION: No acute intracranial abnormality. 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 Electronically Signed   By: Odessa Fleming M.D.   On: 01/01/2023 06:37   CT ABDOMEN PELVIS W CONTRAST  Result Date: 12/26/2022 CLINICAL DATA:  Nausea, constipation, and poor oral intake. Suspected dehydration. EXAM: CT ABDOMEN AND PELVIS WITH CONTRAST TECHNIQUE: Multidetector CT imaging of the abdomen and pelvis was performed using the standard protocol following bolus administration of intravenous contrast. RADIATION DOSE REDUCTION: This exam was performed according to the departmental dose-optimization program which includes automated exposure control, adjustment of the mA and/or kV according to patient size and/or use of iterative reconstruction technique. CONTRAST:  80mL OMNIPAQUE IOHEXOL 300 MG/ML  SOLN COMPARISON:  CT abdomen and pelvis dated 04/14/2008 FINDINGS: Lower chest: No focal consolidation or pulmonary nodule in the lung bases. No pleural effusion or pneumothorax demonstrated. Partially imaged heart size is normal. Hepatobiliary: No focal hepatic lesions. No intra or extrahepatic biliary ductal dilation. Cholelithiasis. Pancreas: 5 mm hypoattenuating focus in the pancreatic head (2:36). No main ductal dilation. Spleen: Subtle, mildly hyperenhancing lobulated focus within the medial spleen measures 3.7 x 3.0 cm (2:16). The focus appears isoattenuating on the delayed images. Adrenals/Urinary Tract: No adrenal nodules. No suspicious renal mass, calculi or hydronephrosis. Bilateral simple/minimally complicated cyst. No specific follow-up imaging recommended. No focal bladder wall thickening. Stomach/Bowel: Normal appearance  of the stomach. No evidence of bowel wall thickening, distention, or inflammatory changes. Colonic  diverticulosis without acute diverticulitis. Small to moderate volume stool within the colon. Normal appendix. Vascular/Lymphatic: Aortic atherosclerosis. No enlarged abdominal or pelvic lymph nodes. Reproductive: No adnexal masses. Other: No free fluid, fluid collection, or free air. Musculoskeletal: No acute or abnormal lytic or blastic osseous lesions. Multilevel degenerative changes of the partially imaged thoracic and lumbar spine. IMPRESSION: 1. No acute abnormality in the abdomen or pelvis. 2. Subtle, mildly hyperenhancing lobulated focus within the medial spleen measures 3.7 x 3.0 cm. The focus appears isoattenuating on the delayed images. This is favored to represent a benign hemangioma. Recommend further evaluation with nonemergent MRI of the abdomen with and without contrast. 3. A 5 mm hypoattenuating focus in the pancreatic head, likely a side branch IPMN. No main ductal dilation. This finding can be assessed at the same time on the above recommended MRI. 4. Aortic Atherosclerosis (ICD10-I70.0). Electronically Signed   By: Agustin Cree M.D.   On: 12/26/2022 15:11   CT Head Wo Contrast  Result Date: 12/26/2022 CLINICAL DATA:  Memory loss EXAM: CT HEAD WITHOUT CONTRAST TECHNIQUE: Contiguous axial images were obtained from the base of the skull through the vertex without intravenous contrast. RADIATION DOSE REDUCTION: This exam was performed according to the departmental dose-optimization program which includes automated exposure control, adjustment of the mA and/or kV according to patient size and/or use of iterative reconstruction technique. COMPARISON:  None Available. FINDINGS: Brain: No CT evidence of an acute cortical infarct. 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 No hemorrhage. No hydrocephalus. Sequela of severe chronic microvascular ischemic change with extensive periventricular and subcortical white matter hypodensity. Generalized volume loss with disproportionate loss in the cerebellar  vermis. Vascular: No hyperdense vessel or unexpected calcification. Skull: Normal. Negative for fracture or focal lesion. Sinuses/Orbits: No middle ear or mastoid effusion. Polypoid mucosal thickening left maxillary sinus. Bilateral lens replacement. Orbits are otherwise unremarkable. Other: None. IMPRESSION: 1. 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 2. Sequela of severe chronic microvascular ischemic change and generalized volume loss with disproportionate loss in the cerebellar vermis. Electronically Signed   By: Lorenza Cambridge M.D.   On: 12/26/2022 15:07   DG Chest Portable 1 View  Result Date: 12/26/2022 CLINICAL DATA:  Malaise. EXAM: PORTABLE CHEST 1 VIEW COMPARISON:  April 14, 2020. FINDINGS: The heart size and mediastinal contours are within normal limits. Both lungs are clear. The visualized skeletal structures are unremarkable. IMPRESSION: No active disease. Electronically Signed   By: Lupita Raider M.D.   On: 12/26/2022 11:53     Discharge Exam: Vitals:   01/20/23 0504 01/20/23 0930  BP: 112/66   Pulse: 78   Resp: 20   Temp: 97.8 F (36.6 C)   SpO2: 95% 96%   Vitals:   01/19/23 2123 01/20/23 0015 01/20/23 0504 01/20/23 0930  BP: (!) 170/77 (!) 155/76 112/66   Pulse: 94 84 78   Resp: 20 20 20    Temp: 99.1 F (37.3 C) 100 F (37.8 C) 97.8 F (36.6 C)   TempSrc: Oral     SpO2: 93% 94% 95% 96%  Weight:        General: Pt is alert, awake, not in acute distress Cardiovascular: RRR, S1/S2 +, no rubs, no gallops Respiratory: CTA bilaterally, no wheezing, no rhonchi Abdominal: Soft, NT, ND, bowel sounds + Extremities: no edema, no cyanosis    The results of significant diagnostics from this hospitalization (including imaging, microbiology, ancillary and  laboratory) are listed below for reference.     Microbiology: No results found for this or any previous visit (from the past 240 hour(s)).   Labs: BNP (last 3 results) No results for input(s): "BNP" in the  last 8760 hours. Basic Metabolic Panel: Recent Labs  Lab 01/19/23 0333 01/20/23 0441  NA 140 137  K 3.5 3.2*  CL 104 102  CO2 27 28  GLUCOSE 128* 99  BUN 17 19  CREATININE 0.83 0.97  CALCIUM 9.9 9.6  MG  --  1.9   Liver Function Tests: Recent Labs  Lab 01/19/23 0333  AST 21  ALT 28  ALKPHOS 126  BILITOT 1.2  PROT 7.1  ALBUMIN 3.7   Recent Labs  Lab 01/19/23 0333  LIPASE 48   No results for input(s): "AMMONIA" in the last 168 hours. CBC: Recent Labs  Lab 01/19/23 0333 01/20/23 0441  WBC 7.1 6.3  NEUTROABS 5.1  --   HGB 13.6 12.3  HCT 42.2 37.5  MCV 91.3 89.1  PLT 282 257   Cardiac Enzymes: No results for input(s): "CKTOTAL", "CKMB", "CKMBINDEX", "TROPONINI" in the last 168 hours. BNP: Invalid input(s): "POCBNP" CBG: No results for input(s): "GLUCAP" in the last 168 hours. D-Dimer No results for input(s): "DDIMER" in the last 72 hours. Hgb A1c No results for input(s): "HGBA1C" in the last 72 hours. Lipid Profile No results for input(s): "CHOL", "HDL", "LDLCALC", "TRIG", "CHOLHDL", "LDLDIRECT" in the last 72 hours. Thyroid function studies No results for input(s): "TSH", "T4TOTAL", "T3FREE", "THYROIDAB" in the last 72 hours.  Invalid input(s): "FREET3" Anemia work up No results for input(s): "VITAMINB12", "FOLATE", "FERRITIN", "TIBC", "IRON", "RETICCTPCT" in the last 72 hours. Urinalysis    Component Value Date/Time   COLORURINE YELLOW 01/19/2023 1320   APPEARANCEUR HAZY (A) 01/19/2023 1320   LABSPEC 1.012 01/19/2023 1320   PHURINE 7.0 01/19/2023 1320   GLUCOSEU NEGATIVE 01/19/2023 1320   HGBUR SMALL (A) 01/19/2023 1320   BILIRUBINUR NEGATIVE 01/19/2023 1320   KETONESUR 5 (A) 01/19/2023 1320   PROTEINUR 100 (A) 01/19/2023 1320   UROBILINOGEN 0.2 04/14/2008 1005   NITRITE NEGATIVE 01/19/2023 1320   LEUKOCYTESUR NEGATIVE 01/19/2023 1320   Sepsis Labs Recent Labs  Lab 01/19/23 0333 01/20/23 0441  WBC 7.1 6.3   Microbiology No results  found for this or any previous visit (from the past 240 hour(s)).   Time coordinating discharge: 35 minutes  SIGNED:   Erick Blinks, DO [facility] 01/20/2023, 12:31 PM  If 7PM-7AM, please contact night-coverage [email or web address]

## 2023-01-20 NOTE — Evaluation (Signed)
Physical Therapy Evaluation Patient Details Name: [name] MRN: [id number] DOB: Jun 25, 1941 Today's Date: 01/20/2023  History of Present Illness  [name] is a 82 y.o. female with medical history significant forhypertension and recurrent kidney stones who presented to the ED due to complaints of weakness and malaise after recent admission for similar issues in the setting of uncontrolled hypertension.  She is now having extreme weakness and cannot ambulate.  Granddaughter at bedside states that she has not been eating or drinking very much and has been more confused as of late.  She is adamant that she has been taking her medications as otherwise scheduled and typically has a blood pressure of approximately 180/70 at home and sometimes notices some acute dips in her blood pressure.  Patient states that she just oftentimes does not feel like eating very much and denies any current nausea or vomiting.   Clinical Impression  Patient demonstrates good return for sitting up at bedside, transferring to/from chair and commode in bathroom and ambulating in room/hallway without loss of balance.  Patient limited for ambulation mostly due to c/o fatigue and tolerated sitting up in chair with her granddaughter present after therapy.  Patient will benefit from continued skilled physical therapy in hospital and recommended venue below to increase strength, balance, endurance for safe ADLs and gait.         Assistance Recommended at Discharge Set up Supervision/Assistance  If plan is discharge home, recommend the following:  Can travel by private vehicle  A little help with walking and/or transfers;A little help with bathing/dressing/bathroom;Help with stairs or ramp for entrance;Assistance with cooking/housework        Equipment Recommendations None recommended by PT  Recommendations for Other Services       Functional Status Assessment Patient has had a recent decline in their functional  status and demonstrates the ability to make significant improvements in function in a reasonable and predictable amount of time.     Precautions / Restrictions Precautions Precautions: Fall Restrictions Weight Bearing Restrictions: No      Mobility  Bed Mobility Overal bed mobility: Modified Independent                  Transfers Overall transfer level: Needs assistance Equipment used: Rolling walker (2 wheels) Transfers: Sit to/from Stand, Bed to chair/wheelchair/BSC Sit to Stand: Min guard   Step pivot transfers: Min guard, Min assist       General transfer comment: good return for transferring to/from commode in bathroom, but requiring Min assist for sit to standing from low commode due to weakness    Ambulation/Gait Ambulation/Gait assistance: Min guard, Supervision Gait Distance (Feet): 55 Feet Assistive device: Rolling walker (2 wheels) Gait Pattern/deviations: Decreased step length - right, Decreased step length - left, Decreased stride length Gait velocity: decreased     General Gait Details: slow labored cadence without loss of balance, but limited mostly due to c/o fatigue  Stairs            Wheelchair Mobility     Tilt Bed    Modified Rankin (Stroke Patients Only)       Balance Overall balance assessment: Needs assistance Sitting-balance support: Feet supported, No upper extremity supported Sitting balance-Leahy Scale: Good Sitting balance - Comments: seated at EOB   Standing balance support: During functional activity, Bilateral upper extremity supported Standing balance-Leahy Scale: Fair Standing balance comment: fair/good using RW  Pertinent Vitals/Pain Pain Assessment Pain Assessment: No/denies pain    Home Living Family/patient expects to be discharged to:: Private residence Living Arrangements: Spouse/significant other;Other relatives Available Help at Discharge: Family;Available 24  hours/day Type of Home: Mobile home Home Access: Stairs to enter Entrance Stairs-Rails: None Entrance Stairs-Number of Steps: 2   Home Layout: One level Home Equipment: Agricultural consultant (2 wheels);Cane - single point      Prior Function Prior Level of Function : Needs assist       Physical Assist : Mobility (physical);ADLs (physical) Mobility (physical): Bed mobility;Transfers;Gait;Stairs   Mobility Comments: household and short distanced community ambulator using RW ADLs Comments: Assisted by family     Hand Dominance   Dominant Hand: Right    Extremity/Trunk Assessment   Upper Extremity Assessment Upper Extremity Assessment: Overall WFL for tasks assessed    Lower Extremity Assessment Lower Extremity Assessment: Generalized weakness    Cervical / Trunk Assessment Cervical / Trunk Assessment: Normal  Communication   Communication: No difficulties  Cognition Arousal/Alertness: Awake/alert Behavior During Therapy: WFL for tasks assessed/performed Overall Cognitive Status: Within Functional Limits for tasks assessed                                          General Comments      Exercises     Assessment/Plan    PT Assessment Patient needs continued PT services  PT Problem List Decreased strength;Decreased activity tolerance;Decreased balance;Decreased mobility       PT Treatment Interventions DME instruction;Gait training;Stair training;Functional mobility training;Therapeutic activities;Therapeutic exercise;Patient/family education;Balance training    PT Goals (Current goals can be found in the Care Plan section)  Acute Rehab PT Goals Patient Stated Goal: return home with family to assist PT Goal Formulation: With patient/family Time For Goal Achievement: 01/23/23 Potential to Achieve Goals: Good    Frequency Min 3X/week     Co-evaluation               AM-PAC PT "6 Clicks" Mobility  Outcome Measure Help needed turning from  your back to your side while in a flat bed without using bedrails?: None Help needed moving from lying on your back to sitting on the side of a flat bed without using bedrails?: None Help needed moving to and from a bed to a chair (including a wheelchair)?: A Little Help needed standing up from a chair using your arms (e.g., wheelchair or bedside chair)?: A Little Help needed to walk in hospital room?: A Little Help needed climbing 3-5 steps with a railing? : A Little 6 Click Score: 20    End of Session   Activity Tolerance: Patient tolerated treatment well;Patient limited by fatigue Patient left: in chair;with call bell/phone within reach;with family/visitor present Nurse Communication: Mobility status PT Visit Diagnosis: Unsteadiness on feet (R26.81);Other abnormalities of gait and mobility (R26.89);Muscle weakness (generalized) (M62.81)    Time: 1610-9604 PT Time Calculation (min) (ACUTE ONLY): 25 min   Charges:   PT Evaluation $PT Eval Moderate Complexity: 1 Mod PT Treatments $Therapeutic Activity: 23-37 mins PT General Charges $$ ACUTE PT VISIT: 1 Visit         12:09 PM, 01/20/23 Ocie Bob, MPT Physical Therapist with [facility] 336 [phone] office [phone] mobile phone

## 2023-01-20 NOTE — TOC Initial Note (Signed)
Transition of Care [facility]) - Initial/Assessment Note    Patient Details  Name: [name] MRN: [id number] Date of Birth: 1941-01-07  Transition of Care [facility] CM/SW Contact:    Elliot Gault, LCSW Phone Number: 01/20/2023, 11:58 AM  Clinical Narrative:                  Pt admitted observation status from home. PT recommending HHPT at dc.  Met with pt at bedside to review dc planning and offer HHPT referral. Per pt, she resides at home and plans to return home at dc. Pt has a cane and walker for ambulation. She has family support for appointments and is able to obtain medications.  Pt agreeable to [facility] PT at dc. CMS provider options reviewed and pt agreeable to referral to any agency that will accept her insurance.  Referral made to [facility] who accepted the referral. Information added to AVS.  TOC will follow and assist if any other needs arise.  Expected Discharge Plan: Home w Home Health Services Barriers to Discharge: Continued Medical Work up   Patient Goals and CMS Choice Patient states their goals for this hospitalization and ongoing recovery are:: return home CMS Medicare.gov Compare Post Acute Care list provided to:: Patient Choice offered to / list presented to : Patient      Expected Discharge Plan and Services In-house Referral: Clinical Social Work   Post Acute Care Choice: Home Health Living arrangements for the past 2 months: Single Family Home                           HH Arranged: PT HH Agency: [facility] Date HH Agency Contacted: 01/20/23   Representative spoke with at [facility] Agency: Morrie Sheldon  Prior Living Arrangements/Services Living arrangements for the past 2 months: Single Family Home Lives with:: Spouse Patient language and need for interpreter reviewed:: Yes Do you feel safe going back to the place where you live?: Yes      Need for Family Participation in Patient Care: Yes (Comment) Care giver support system in  place?: Yes (comment) Current home services: DME Criminal Activity/Legal Involvement Pertinent to Current Situation/Hospitalization: No - Comment as needed  Activities of Daily Living Home Assistive Devices/Equipment: Eyeglasses, Environmental consultant (specify type), Shower chair without back, Cane (specify quad or straight) ADL Screening (condition at time of admission) Patient's cognitive ability adequate to safely complete daily activities?: No Is the patient deaf or have difficulty hearing?: No Does the patient have difficulty seeing, even when wearing glasses/contacts?: No Does the patient have difficulty concentrating, remembering, or making decisions?: Yes Patient able to express need for assistance with ADLs?: No Does the patient have difficulty dressing or bathing?: Yes Independently performs ADLs?: No Communication: Independent Dressing (OT): Needs assistance Is this a change from baseline?: Pre-admission baseline Grooming: Needs assistance Is this a change from baseline?: Change from baseline, expected to last <3 days Feeding: Independent Bathing: Needs assistance Is this a change from baseline?: Pre-admission baseline Toileting: Needs assistance Is this a change from baseline?: Change from baseline, expected to last <3 days In/Out Bed: Needs assistance Is this a change from baseline?: Change from baseline, expected to last <3 days Walks in Home: Independent with device (comment) Is this a change from baseline?: Pre-admission baseline Does the patient have difficulty walking or climbing stairs?: Yes Weakness of Legs: Both Weakness of Arms/Hands: Both  Permission Sought/Granted Permission sought to share information with : Facility Contact  Representative Permission granted to share information with : Yes, Verbal Permission Granted     Permission granted to share info w AGENCY: HH        Emotional Assessment Appearance:: Appears stated age Attitude/Demeanor/Rapport:  Engaged Affect (typically observed): Pleasant Orientation: : Oriented to Self, Oriented to Place, Oriented to  Time, Oriented to Situation Alcohol / Substance Use: Not Applicable Psych Involvement: No (comment)  Admission diagnosis:  Weakness [R53.1] Dizzy [R42] Unable to ambulate [R26.2] Patient Active Problem List   Diagnosis Date Noted   Acute metabolic encephalopathy 01/01/2023   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   Pancreatic lesion 12/31/2022   Constipation 12/31/2022   Loss of weight 12/31/2022   Nausea without vomiting 12/31/2022   Acute respiratory disease due to COVID-19 virus 04/15/2020   Anorexia--- Covid related 04/15/2020   Pneumonia due to COVID-19 virus 04/15/2020   Generalized weakness 04/14/2020   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   Essential hypertension 07/19/2019   Hypercalcemia 07/19/2019   Stage 3a chronic kidney disease (HCC) 07/19/2019   PCP:  Elfredia Nevins, MD Pharmacy:   [id number] - [place], [place] - [address] [place] [address] Phone: [phone] Fax: [phone]  [id number] - [place], [place] - [address] AT [facility] [address] [place] [address] Phone: [phone] Fax: [phone]     Social Determinants of Health (SDOH) Social History: SDOH Screenings   Food Insecurity: No Food Insecurity (01/19/2023)  Housing: Low Risk  (01/19/2023)  Transportation Needs: No Transportation Needs (01/19/2023)  Utilities: Not At Risk (01/19/2023)  Tobacco Use: Medium Risk (01/19/2023)   SDOH Interventions:     Readmission Risk Interventions     No data to display

## 2023-01-21 DIAGNOSIS — Z556 Problems related to health literacy: Secondary | ICD-10-CM | POA: Diagnosis not present

## 2023-01-21 DIAGNOSIS — D7389 Other diseases of spleen: Secondary | ICD-10-CM | POA: Diagnosis not present

## 2023-01-21 DIAGNOSIS — K869 Disease of pancreas, unspecified: Secondary | ICD-10-CM | POA: Diagnosis not present

## 2023-01-21 DIAGNOSIS — I10 Essential (primary) hypertension: Secondary | ICD-10-CM | POA: Diagnosis not present

## 2023-01-21 DIAGNOSIS — Z681 Body mass index (BMI) 19 or less, adult: Secondary | ICD-10-CM | POA: Diagnosis not present

## 2023-01-21 DIAGNOSIS — R634 Abnormal weight loss: Secondary | ICD-10-CM | POA: Diagnosis not present

## 2023-01-21 DIAGNOSIS — R531 Weakness: Secondary | ICD-10-CM | POA: Diagnosis not present

## 2023-01-26 DIAGNOSIS — R531 Weakness: Secondary | ICD-10-CM | POA: Diagnosis not present

## 2023-01-26 DIAGNOSIS — I10 Essential (primary) hypertension: Secondary | ICD-10-CM | POA: Diagnosis not present

## 2023-01-26 DIAGNOSIS — R634 Abnormal weight loss: Secondary | ICD-10-CM | POA: Diagnosis not present

## 2023-01-26 DIAGNOSIS — D7389 Other diseases of spleen: Secondary | ICD-10-CM | POA: Diagnosis not present

## 2023-01-26 DIAGNOSIS — Z681 Body mass index (BMI) 19 or less, adult: Secondary | ICD-10-CM | POA: Diagnosis not present

## 2023-01-26 DIAGNOSIS — K869 Disease of pancreas, unspecified: Secondary | ICD-10-CM | POA: Diagnosis not present

## 2023-01-26 DIAGNOSIS — Z556 Problems related to health literacy: Secondary | ICD-10-CM | POA: Diagnosis not present

## 2023-01-28 ENCOUNTER — Ambulatory Visit: Payer: Medicare Other | Admitting: Neurology

## 2023-01-28 ENCOUNTER — Encounter: Payer: Self-pay | Admitting: Neurology

## 2023-01-28 VITALS — BP 192/92 | HR 77 | Ht 70.0 in | Wt 124.4 lb

## 2023-01-28 DIAGNOSIS — K862 Cyst of pancreas: Secondary | ICD-10-CM | POA: Diagnosis not present

## 2023-01-28 DIAGNOSIS — N183 Chronic kidney disease, stage 3 unspecified: Secondary | ICD-10-CM | POA: Diagnosis not present

## 2023-01-28 DIAGNOSIS — N2889 Other specified disorders of kidney and ureter: Secondary | ICD-10-CM | POA: Diagnosis not present

## 2023-01-28 DIAGNOSIS — K869 Disease of pancreas, unspecified: Secondary | ICD-10-CM | POA: Diagnosis not present

## 2023-01-28 DIAGNOSIS — D7389 Other diseases of spleen: Secondary | ICD-10-CM | POA: Diagnosis not present

## 2023-01-28 DIAGNOSIS — R634 Abnormal weight loss: Secondary | ICD-10-CM | POA: Diagnosis not present

## 2023-01-28 DIAGNOSIS — R630 Anorexia: Secondary | ICD-10-CM

## 2023-01-28 DIAGNOSIS — G9341 Metabolic encephalopathy: Secondary | ICD-10-CM | POA: Diagnosis not present

## 2023-01-28 DIAGNOSIS — F05 Delirium due to known physiological condition: Secondary | ICD-10-CM

## 2023-01-28 DIAGNOSIS — I10 Essential (primary) hypertension: Secondary | ICD-10-CM | POA: Diagnosis not present

## 2023-01-28 DIAGNOSIS — E860 Dehydration: Secondary | ICD-10-CM | POA: Diagnosis not present

## 2023-01-28 DIAGNOSIS — Z681 Body mass index (BMI) 19 or less, adult: Secondary | ICD-10-CM | POA: Diagnosis not present

## 2023-01-28 DIAGNOSIS — K5900 Constipation, unspecified: Secondary | ICD-10-CM | POA: Diagnosis not present

## 2023-01-28 MED ORDER — MEMANTINE HCL 5 MG PO TABS: 5.0000 mg | ORAL_TABLET | Freq: Two times a day (BID) | ORAL | 1 refills | Status: DC

## 2023-01-28 NOTE — Progress Notes (Signed)
[facility]  Provider:  Dr Vickey Huger Referring Provider: Erick Blinks, DO Primary Care Physician:  Elfredia Nevins, MD  Chief Complaint  Patient presents with   New Patient (Initial Visit)    NEW  Memory Patient in room #2 with her granddaughter and husband waiting in the lobby.      HPI:  [name] is a 82 y.o. female and seen here upon referral from Dr. Ocie Doyne D. Sherryll Burger ,DO for a Consultation/ Evaluation of Confusion and Agitation. She was aggressive towards her granddaughter while in hospital. She felt paranoid. Sundowning. Active at night.  She is forgetting to eat and drink. Loss of appetite.    Her grand daughter has been staying over night for the last weeks, the patient lives with her husband in a private home. One dog. She has no set routines for meal times.  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  This patient was referred to neurology by ED  before she was seeing her PCP, Dr Sherwood Gambler.  This patient herself reports onset of memory loss over a period of 2-4 months. She doesn't drive anymore after passing the exit- that was 6 years ago.    There have been many ED visits 5-24-for urinary frequency.  12-26-2022 ED visit for constipation. There was a mass found in her intestines, she has a colonoscopy scheduled.   6-13-- 14th ED vsit with weakness. KRYSTEN VERONICA is a 82 y.o. female with medical history significant for hypertension and recurrent kidney stones who presented to the ED due to complaints of weakness and malaise as well as decreased appetite and persistent nausea with dry heaving.  She is also noted to have some confusion per son at bedside.  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  She has followed up with GI on 6/11  with plans to have MRI.  She continues to have some nausea with dry heaving and has not taken her home blood pressure medications in the last several days.  She denies any diarrhea, fevers, or chills.  She has notably had a 20 pound weight loss that was unintentional over the past year and also has some trouble with constipation.   She had another ED visit on 01-19-2023,  seriously high BP, systolic BP over 409, unable to move" again called " weakness", but ended up for confusion, delirium, dementia (?) ' She was admitted for evaluation of generalized weakness in the setting of failure to thrive as well as some uncontrolled hypertension. She appears to have some progression of her dementia and was seen by palliative care and is now DNR and will be referred to outpatient palliative evaluation. Patient and granddaughter are concerned about dementia and would like referral to neurology which has been arranged for further evaluation and management. It appears that her long-term prognosis is poor as she continues to have weight loss with poor appetite. She continues to remain at high risk for readmissions. No other acute events or concerns noted throughout the course of the stay.   I noted her MRI showed atrophy, white matter disease and shrunken hippocampus area. She has had uncontrolled  HTN.  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 Ascites.  Review of Systems: Out of a complete 14 system review, the patient complains of only the following symptoms, and all other reviewed systems are negative.    Social History   Socioeconomic History   Marital status: Married    Spouse name: Not on file   Number of children: Not on file   Years of education: Not on file   Highest education level: Not on file  Occupational History   Not on file  Tobacco Use   Smoking status: Former    Passive exposure: Past   Smokeless tobacco: Never  Vaping Use   Vaping Use:  Not on file  Substance and Sexual Activity   Alcohol use: No   Drug use: Never   Sexual activity: Not on file  Other Topics Concern   Not on file  Social History Narrative   Not on file   Social Determinants of Health   Financial Resource Strain: Not on file  Food Insecurity: No Food Insecurity (01/19/2023)   Hunger Vital Sign    Worried About Running Out of Food in the Last Year: Never true    Ran Out of Food in the Last Year: Never true  Transportation Needs: No Transportation Needs (01/19/2023)   PRAPARE - Administrator, Civil Service (Medical): No    Lack of Transportation (Non-Medical): No  Physical Activity: Not on file  Stress: Not on file  Social Connections: Not on file  Intimate Partner Violence: Not At Risk (01/19/2023)   Humiliation, Afraid, Rape, and Kick questionnaire    Fear of Current or Ex-Partner: No    Emotionally Abused: No    Physically Abused: No    Sexually Abused: No    No family history on file.  Past Medical History:  Diagnosis Date   History of kidney stones    Hypertension     Past Surgical History:  Procedure Laterality Date   CATARACT EXTRACTION W/PHACO Left 05/02/2019   Procedure: CATARACT EXTRACTION PHACO AND INTRAOCULAR LENS PLACEMENT (IOC);  Surgeon: Fabio Pierce, MD;  Location: [facility];  Service: Ophthalmology;  Laterality: Left;  CDE: 27.19   CATARACT EXTRACTION W/PHACO Right 05/16/2019   Procedure: CATARACT EXTRACTION PHACO AND INTRAOCULAR LENS PLACEMENT RIGHT EYE;  Surgeon: Fabio Pierce, MD;  Location: [facility];  Service: Ophthalmology;  Laterality: Right;  CDE: 18.89   CESAREAN SECTION      Current Outpatient Medications  Medication Sig Dispense Refill   acetaminophen (TYLENOL) 500 MG tablet Take 1,000 mg by mouth daily as needed for mild pain.     ALPRAZolam (XANAX) 0.25 MG tablet Take 0.25 mg by mouth 2 (two) times daily as needed for anxiety.     atorvastatin (LIPITOR) 10 MG tablet Take 10 mg by mouth daily.       carvedilol (COREG) 3.125 MG tablet Take 3.125 mg by mouth 2 (two) times daily with a meal.     Cyanocobalamin (B-12 COMPLIANCE INJECTION IJ) Inject as directed. One monthly     hydrALAZINE (APRESOLINE) 50 MG tablet Take 50 mg by mouth 3 (three) times daily.     losartan (COZAAR) 100 MG tablet Take 1 tablet (100 mg total) by mouth daily. 30 tablet 3   ondansetron (ZOFRAN) 4 MG tablet Take 1 tablet (4 mg total) by mouth daily as needed for nausea or vomiting. 30 tablet 0   feeding supplement, ENSURE ENLIVE, (ENSURE ENLIVE) LIQD Take 237 mLs by mouth 2 (two) times daily between meals. (Patient not taking: Reported on 12/30/2022) 2370  mL 1   guaiFENesin-dextromethorphan (ROBITUSSIN DM) 100-10 MG/5ML syrup Take 10 mLs by mouth every 4 (four) hours as needed for cough. (Patient not taking: Reported on 01/02/2023) 118 mL 0   LORazepam (ATIVAN) 0.5 MG tablet Take 1/2 tablet 1 hour prior to MRI, can take second half 30 minutes prior if anxiety still present (Patient not taking: Reported on 01/02/2023) 1 tablet 0   zinc sulfate 220 (50 Zn) MG capsule Take 1 capsule (220 mg total) by mouth daily. (Patient not taking: Reported on 01/02/2023) 30 capsule 1   No current facility-administered medications for this visit.    Allergies as of 01/28/2023   (No Known Allergies)    Vitals: BP (!) 192/92 (BP Location: Left Arm, Patient Position: Sitting, Cuff Size: Small)   Pulse 77   Ht 5\' 10"  (1.778 m)   Wt 124 lb 6.4 oz (56.4 kg)   BMI 17.85 kg/m  Last Weight:  Wt Readings from Last 1 Encounters:  01/28/23 124 lb 6.4 oz (56.4 kg)   Last Height:   Ht Readings from Last 1 Encounters:  01/28/23 5\' 10"  (1.778 m)   BMI 17.8 - lost over 50 pounds in 12 months.    Physical exam:  General: The patient is awake and appears not in acute distress.  The patient is well groomed. Head: Normocephalic, atraumatic.  Neck is supple. No Goiter.   Neck circumference:13 Cardiovascular:  Regular rate and palpable  peripheral pulse:  Respiratory: clear to auscultation.   Skin:  With evidence of edema, or rash Trunk: losing weight.    Neurologic exam : The patient is awake and alert, oriented to place and time.  Memory subjective described as intact.      01/28/2023   11:15 AM  MMSE - Mini Mental State Exam  Orientation to time 5  Orientation to Place 5  Registration 3  Attention/ Calculation 1  Recall 0  Language- name 2 objects 2  Language- repeat 1  Language- follow 3 step command 3  Language- read & follow direction 1  Write a sentence 1  Copy design 0  Total score 22     There is a restricted attention span & concentration ability.  Speech is non-fluent with dysphonia, aphasia.  Mood and affect are appropriate.  Cranial nerves: Pupils are equal and briskly reactive to light. Extraocular movements  in vertical and horizontal planes intact and without nystagmus. Visual fields by finger perimetry are intact. Hearing to finger rub intact.  Facial sensation intact to fine touch. Facial motor strength is symmetric and tongue was tremulous, deviated to the left   Motor exam:   Normal tone and normal muscle bulk and symmetric normal strength in all extremities. Grip Strength is preserved Proximal strength of shoulder muscles and hip flexors was 4/5.  Sensory:  Fine touch and vibration were tested . Proprioception was tested in the upper extremities only and was abnormal. There is a pronator drift ,   Coordination: Rapid alternating movements in the fingers/hands were normal.  Finger-to-nose maneuver was tested and showed no evidence of ataxia or tremor.  Gait and station: Patient walked with a walker as assistive device - was on a cane 3 years, now since June on a walker. The patient stated it was for about 4 months ( time frame) . Core Strength reduced. Deep tendon reflexes: in the  upper and lower extremities are symmetric   Assessment: Total time for face to face interview and  examination, for review of  images and laboratory testing, neurophysiology testing and pre-existing records, including out-of -network , was 50 minutes.  Assessment is as follows here:  1)  dementia by MMSE with 22/ 30 points, the history of development of neurocognitive deficits has been contradictory, tie frame orientation by patient is challenged and report by granddaughter may be compassionate, but more true.    2) there was a period of delirium , combative , confusion in hospital. There was low potassium. Low protein, low muscle mass- Bilirubin is elevated .   Plan:  Treatment plan and additional workup planned after today includes:   1)  evaluation for possible malignancy by internal medicine/ GI  and if positive oncology  2)  metabolic changes go hand in hand with GI illness. 3)  I will evaluate for dementia type, check AD panel, start namenda.  Ordered B12 and  thiamine level, I like her to start a multivitamin. Add ensure or protein supplements.  Results will be shared with dr Sherwood Gambler.   Melvyn Novas, MD   RV in 4-6 months.

## 2023-01-28 NOTE — Patient Instructions (Signed)
Assessment: Total time for face to face interview and examination, for review of  images and laboratory testing, neurophysiology testing and pre-existing records, including out-of -network , was 50 minutes.   Assessment is as follows here:   1) Dementia likely present by STM loss, by MMSE with 22/ 30 points, the history of development of neurocognitive deficits has been contradictory, tie frame orientation by patient is challenged and report by granddaughter may be compassionate, but more true.    2) there was a period of delirium , combative , confusion in hospital. There was low potassium. Low protein, low muscle mass- Bilirubin is elevated .    Plan:  Treatment plan and additional workup planned after today includes:    1)  evaluation for possible malignancy by internal medicine/ GI  and if positive oncology  2)  metabolic changes go hand in hand with GI illness. 3)  I will evaluate for dementia type, check AD panel, start namenda.  Ordered B12 and  thiamine level, I like her to start a multivitamin. Add ensure or protein supplements.  Results will be shared with dr Sherwood Gambler.

## 2023-01-29 DIAGNOSIS — I10 Essential (primary) hypertension: Secondary | ICD-10-CM | POA: Diagnosis not present

## 2023-01-29 DIAGNOSIS — K869 Disease of pancreas, unspecified: Secondary | ICD-10-CM | POA: Diagnosis not present

## 2023-01-29 DIAGNOSIS — Z681 Body mass index (BMI) 19 or less, adult: Secondary | ICD-10-CM | POA: Diagnosis not present

## 2023-01-29 DIAGNOSIS — R634 Abnormal weight loss: Secondary | ICD-10-CM | POA: Diagnosis not present

## 2023-01-29 DIAGNOSIS — D7389 Other diseases of spleen: Secondary | ICD-10-CM | POA: Diagnosis not present

## 2023-01-29 DIAGNOSIS — R531 Weakness: Secondary | ICD-10-CM | POA: Diagnosis not present

## 2023-01-29 DIAGNOSIS — Z556 Problems related to health literacy: Secondary | ICD-10-CM | POA: Diagnosis not present

## 2023-02-02 ENCOUNTER — Encounter: Payer: Self-pay | Admitting: Psychology

## 2023-02-04 ENCOUNTER — Encounter (HOSPITAL_COMMUNITY): Payer: Self-pay

## 2023-02-04 ENCOUNTER — Other Ambulatory Visit: Payer: Self-pay

## 2023-02-04 ENCOUNTER — Encounter (HOSPITAL_COMMUNITY)
Admission: RE | Admit: 2023-02-04 | Discharge: 2023-02-04 | Disposition: A | Discharge status: Home / Self Care | Payer: Medicare Other | Source: Ambulatory Visit | Attending: Gastroenterology | Admitting: Gastroenterology

## 2023-02-04 ENCOUNTER — Encounter (HOSPITAL_COMMUNITY): Payer: Self-pay | Admitting: Anesthesiology

## 2023-02-04 HISTORY — DX: Prediabetes: R73.03

## 2023-02-04 LAB — ATN PROFILE
A -- Beta-amyloid 42/40 Ratio: 0.093 — ABNORMAL LOW (ref >0.102)
Beta-amyloid 40: 174.03 pg/mL
Beta-amyloid 42: 16.1 pg/mL
N -- NfL, Plasma: 10.6 pg/mL (ref 0.00–11.55)
T -- p-tau181: 2.01 pg/mL — ABNORMAL HIGH (ref 0.00–0.97)

## 2023-02-05 ENCOUNTER — Other Ambulatory Visit (INDEPENDENT_AMBULATORY_CARE_PROVIDER_SITE_OTHER): Payer: Self-pay | Admitting: Gastroenterology

## 2023-02-05 ENCOUNTER — Telehealth: Payer: Self-pay | Admitting: *Deleted

## 2023-02-05 MED ORDER — PEG 3350-KCL-NA BICARB-NACL 420 G PO SOLR: 4000.0000 mL | Freq: Once | ORAL | 0 refills | Status: AC

## 2023-02-05 NOTE — Telephone Encounter (Signed)
Pt grand daughter showed up at [address] [facility] and I spoke with her. She did not have instructions for the prep she picked up at the pharmacy. Instructions done and Dewayne Hatch gave to grand daughter

## 2023-02-05 NOTE — Pre-Procedure Instructions (Signed)
Patient's granddaughter [name] calls and states that [facility] does not have the prep for her grand mother's procedure. She also states that her grandmother ate after 2 yesterday afternoon but has not eaten today. I messaged Mindy and Tanya to let them know and they will let patient know what to do.

## 2023-02-06 DIAGNOSIS — R194 Change in bowel habit: Secondary | ICD-10-CM

## 2023-02-06 DIAGNOSIS — R634 Abnormal weight loss: Secondary | ICD-10-CM

## 2023-02-09 ENCOUNTER — Telehealth: Payer: Self-pay

## 2023-02-09 NOTE — Telephone Encounter (Signed)
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   Cc Dr . Maurilio Lovely, DO.  Dr Sherwood Gambler. , Ihor Austin.

## 2023-02-09 NOTE — Telephone Encounter (Signed)
I spoke with the patient's granddaughter, Delbert Harness (per [facility]). She verbalized understanding of the findings and expressed appreciation for the call. She would like to know what stage of AD her grandmother has.

## 2023-02-09 NOTE — Telephone Encounter (Signed)
Patient is scheduled for RV in January. Is sooner visit needed?

## 2023-02-10 ENCOUNTER — Other Ambulatory Visit (INDEPENDENT_AMBULATORY_CARE_PROVIDER_SITE_OTHER): Payer: Self-pay | Admitting: Gastroenterology

## 2023-02-10 ENCOUNTER — Encounter (INDEPENDENT_AMBULATORY_CARE_PROVIDER_SITE_OTHER): Payer: Self-pay

## 2023-02-10 DIAGNOSIS — I10 Essential (primary) hypertension: Secondary | ICD-10-CM | POA: Diagnosis not present

## 2023-02-10 DIAGNOSIS — D7389 Other diseases of spleen: Secondary | ICD-10-CM | POA: Diagnosis not present

## 2023-02-10 DIAGNOSIS — R634 Abnormal weight loss: Secondary | ICD-10-CM | POA: Diagnosis not present

## 2023-02-10 DIAGNOSIS — Z681 Body mass index (BMI) 19 or less, adult: Secondary | ICD-10-CM | POA: Diagnosis not present

## 2023-02-10 DIAGNOSIS — Z556 Problems related to health literacy: Secondary | ICD-10-CM | POA: Diagnosis not present

## 2023-02-10 DIAGNOSIS — K869 Disease of pancreas, unspecified: Secondary | ICD-10-CM | POA: Diagnosis not present

## 2023-02-10 DIAGNOSIS — R531 Weakness: Secondary | ICD-10-CM | POA: Diagnosis not present

## 2023-02-10 MED ORDER — CLENPIQ 10-3.5-12 MG-GM -GM/175ML PO SOLN: 1.0000 | ORAL | 0 refills | Status: AC

## 2023-02-11 ENCOUNTER — Telehealth (INDEPENDENT_AMBULATORY_CARE_PROVIDER_SITE_OTHER): Payer: Self-pay | Admitting: Gastroenterology

## 2023-02-11 MED ORDER — SUTAB 1479-225-188 MG PO TABS: ORAL_TABLET | ORAL | 0 refills | Status: DC

## 2023-02-11 NOTE — Telephone Encounter (Signed)
Pt grand daughter called in and states that Trylite did not go well with pt. (Pt TCS had to be rescheduled from 02/06/23 to 03/10/23). Grand daughter states she would like to try tablets. Advised grand daughter that insurance may not cover tablets. Sent tablets in to pharmacy.

## 2023-02-11 NOTE — Telephone Encounter (Signed)
I spoke with the patient's granddaughter, [name] and relayed the message. She verbalized understanding of the message and expressed appreciation for the call. She will have the patient follow up with PCP. All questions answered.

## 2023-02-11 NOTE — Telephone Encounter (Signed)
Yes I just wanted to make sure you were not requesting patient to be seen earlier. Aundra Millet, can you please let daughter know Dr. Oliva Bustard recommendations. Thank you.

## 2023-02-17 DIAGNOSIS — D7389 Other diseases of spleen: Secondary | ICD-10-CM | POA: Diagnosis not present

## 2023-02-17 DIAGNOSIS — K869 Disease of pancreas, unspecified: Secondary | ICD-10-CM | POA: Diagnosis not present

## 2023-02-17 DIAGNOSIS — R634 Abnormal weight loss: Secondary | ICD-10-CM | POA: Diagnosis not present

## 2023-02-17 DIAGNOSIS — I10 Essential (primary) hypertension: Secondary | ICD-10-CM | POA: Diagnosis not present

## 2023-02-17 DIAGNOSIS — R531 Weakness: Secondary | ICD-10-CM | POA: Diagnosis not present

## 2023-02-17 DIAGNOSIS — Z556 Problems related to health literacy: Secondary | ICD-10-CM | POA: Diagnosis not present

## 2023-02-17 DIAGNOSIS — Z681 Body mass index (BMI) 19 or less, adult: Secondary | ICD-10-CM | POA: Diagnosis not present

## 2023-02-19 ENCOUNTER — Other Ambulatory Visit: Payer: Self-pay | Admitting: Neurology

## 2023-03-04 ENCOUNTER — Other Ambulatory Visit: Payer: Self-pay

## 2023-03-04 ENCOUNTER — Encounter (HOSPITAL_COMMUNITY): Payer: Self-pay

## 2023-03-05 ENCOUNTER — Encounter (HOSPITAL_COMMUNITY)
Admission: RE | Admit: 2023-03-05 | Discharge: 2023-03-05 | Disposition: A | Discharge status: Home / Self Care | Payer: Medicare Other | Source: Ambulatory Visit | Attending: Gastroenterology | Admitting: Gastroenterology

## 2023-03-10 ENCOUNTER — Encounter (HOSPITAL_COMMUNITY)
Admission: RE | Disposition: A | Discharge status: Home / Self Care | Payer: Self-pay | Source: Ambulatory Visit | Attending: Gastroenterology

## 2023-03-10 ENCOUNTER — Encounter (HOSPITAL_COMMUNITY): Payer: Self-pay | Admitting: Gastroenterology

## 2023-03-10 ENCOUNTER — Ambulatory Visit (HOSPITAL_COMMUNITY)
Admission: RE | Admit: 2023-03-10 | Discharge: 2023-03-10 | Disposition: A | Discharge status: Home / Self Care | Payer: Medicare Other | Source: Ambulatory Visit | Attending: Gastroenterology | Admitting: Gastroenterology

## 2023-03-10 ENCOUNTER — Telehealth (INDEPENDENT_AMBULATORY_CARE_PROVIDER_SITE_OTHER): Payer: Self-pay | Admitting: Gastroenterology

## 2023-03-10 ENCOUNTER — Encounter (INDEPENDENT_AMBULATORY_CARE_PROVIDER_SITE_OTHER): Payer: Self-pay

## 2023-03-10 ENCOUNTER — Ambulatory Visit (HOSPITAL_COMMUNITY): Admit: 2023-03-10 | Payer: Medicare Other | Admitting: Gastroenterology

## 2023-03-10 ENCOUNTER — Ambulatory Visit (HOSPITAL_COMMUNITY): Payer: Medicare Other | Admitting: Anesthesiology

## 2023-03-10 ENCOUNTER — Encounter (HOSPITAL_COMMUNITY): Payer: Self-pay

## 2023-03-10 ENCOUNTER — Ambulatory Visit (HOSPITAL_BASED_OUTPATIENT_CLINIC_OR_DEPARTMENT_OTHER): Payer: Medicare Other | Admitting: Anesthesiology

## 2023-03-10 DIAGNOSIS — D120 Benign neoplasm of cecum: Secondary | ICD-10-CM | POA: Insufficient documentation

## 2023-03-10 DIAGNOSIS — K5900 Constipation, unspecified: Secondary | ICD-10-CM | POA: Insufficient documentation

## 2023-03-10 DIAGNOSIS — D126 Benign neoplasm of colon, unspecified: Secondary | ICD-10-CM

## 2023-03-10 DIAGNOSIS — K639 Disease of intestine, unspecified: Secondary | ICD-10-CM

## 2023-03-10 DIAGNOSIS — F419 Anxiety disorder, unspecified: Secondary | ICD-10-CM | POA: Diagnosis not present

## 2023-03-10 DIAGNOSIS — D128 Benign neoplasm of rectum: Secondary | ICD-10-CM | POA: Insufficient documentation

## 2023-03-10 DIAGNOSIS — D123 Benign neoplasm of transverse colon: Secondary | ICD-10-CM | POA: Insufficient documentation

## 2023-03-10 DIAGNOSIS — D122 Benign neoplasm of ascending colon: Secondary | ICD-10-CM | POA: Diagnosis not present

## 2023-03-10 DIAGNOSIS — Z87442 Personal history of urinary calculi: Secondary | ICD-10-CM | POA: Insufficient documentation

## 2023-03-10 DIAGNOSIS — D124 Benign neoplasm of descending colon: Secondary | ICD-10-CM | POA: Diagnosis not present

## 2023-03-10 DIAGNOSIS — Z79899 Other long term (current) drug therapy: Secondary | ICD-10-CM | POA: Insufficient documentation

## 2023-03-10 DIAGNOSIS — I10 Essential (primary) hypertension: Secondary | ICD-10-CM | POA: Insufficient documentation

## 2023-03-10 DIAGNOSIS — K648 Other hemorrhoids: Secondary | ICD-10-CM | POA: Insufficient documentation

## 2023-03-10 DIAGNOSIS — K5730 Diverticulosis of large intestine without perforation or abscess without bleeding: Secondary | ICD-10-CM

## 2023-03-10 DIAGNOSIS — K625 Hemorrhage of anus and rectum: Secondary | ICD-10-CM | POA: Insufficient documentation

## 2023-03-10 DIAGNOSIS — K635 Polyp of colon: Secondary | ICD-10-CM | POA: Diagnosis not present

## 2023-03-10 DIAGNOSIS — R634 Abnormal weight loss: Secondary | ICD-10-CM

## 2023-03-10 DIAGNOSIS — N289 Disorder of kidney and ureter, unspecified: Secondary | ICD-10-CM | POA: Diagnosis not present

## 2023-03-10 DIAGNOSIS — Z87891 Personal history of nicotine dependence: Secondary | ICD-10-CM | POA: Diagnosis not present

## 2023-03-10 DIAGNOSIS — R194 Change in bowel habit: Secondary | ICD-10-CM | POA: Diagnosis present

## 2023-03-10 HISTORY — PX: POLYPECTOMY: SHX5525

## 2023-03-10 HISTORY — PX: COLONOSCOPY WITH PROPOFOL: SHX5780

## 2023-03-10 LAB — HM COLONOSCOPY

## 2023-03-10 LAB — GLUCOSE, CAPILLARY: Glucose-Capillary: 80 mg/dL (ref 70–99)

## 2023-03-10 SURGERY — COLONOSCOPY WITH PROPOFOL
Anesthesia: Monitor Anesthesia Care

## 2023-03-10 SURGERY — COLONOSCOPY WITH PROPOFOL
Anesthesia: General

## 2023-03-10 MED ORDER — PROPOFOL 500 MG/50ML IV EMUL
INTRAVENOUS | Status: DC | PRN
  Administered 2023-03-10: 125 ug/kg/min via INTRAVENOUS

## 2023-03-10 MED ORDER — EPHEDRINE SULFATE (PRESSORS) 50 MG/ML IJ SOLN
INTRAMUSCULAR | Status: DC | PRN
  Administered 2023-03-10: 5 mg via INTRAVENOUS

## 2023-03-10 MED ORDER — PROPOFOL 10 MG/ML IV BOLUS
INTRAVENOUS | Status: DC | PRN
  Administered 2023-03-10: 30 mg via INTRAVENOUS
  Administered 2023-03-10: 75 mg via INTRAVENOUS

## 2023-03-10 MED ORDER — PHENYLEPHRINE 80 MCG/ML (10ML) SYRINGE FOR IV PUSH (FOR BLOOD PRESSURE SUPPORT)
PREFILLED_SYRINGE | INTRAVENOUS | Status: DC | PRN
  Administered 2023-03-10 (×5): 80 ug via INTRAVENOUS

## 2023-03-10 MED ORDER — LACTATED RINGERS IV SOLN: INTRAVENOUS | Status: DC

## 2023-03-10 NOTE — Discharge Instructions (Signed)
You are being discharged to home.  Resume your previous diet.  We are waiting for your pathology results.  Your physician has recommended a repeat colonoscopy in three years for surveillance.  Take Miralax three times a day.

## 2023-03-10 NOTE — Telephone Encounter (Signed)
Dolores Frame, MD  Marlowe Shores, LPN Hi Kenney Houseman,  Can you please schedule a CT enterography? Dx: Small bowel lesion, rule out malignancy.  Thanks,  Katrinka Blazing, MD Gastroenterology and Hepatology [facility]

## 2023-03-10 NOTE — Transfer of Care (Signed)
Immediate Anesthesia Transfer of Care Note  Patient: [name]  Procedure(s) Performed: COLONOSCOPY WITH PROPOFOL POLYPECTOMY  Patient Location: Short Stay  Anesthesia Type:General  Level of Consciousness: awake  Airway & Oxygen Therapy: Patient Spontanous Breathing  Post-op Assessment: Report given to RN  Post vital signs: Reviewed and stable  Last Vitals:  Vitals Value Taken Time  BP    Temp    Pulse 66 03/10/23 0959  Resp 20 03/10/23 0959  SpO2 100 % 03/10/23 0959  Vitals shown include unfiled device data.  Last Pain:  Vitals:   03/10/23 0917  PainSc: 0-No pain         Complications: No notable events documented.

## 2023-03-10 NOTE — Anesthesia Preprocedure Evaluation (Signed)
Anesthesia Evaluation  Patient identified by MRN, date of birth, ID band Patient awake    Reviewed: Allergy & Precautions, H&P , NPO status , Patient's Chart, lab work & pertinent test results, reviewed documented beta blocker date and time   Airway Mallampati: II  TM Distance: >3 FB Neck ROM: full    Dental no notable dental hx.    Pulmonary neg pulmonary ROS, pneumonia, former smoker   Pulmonary exam normal breath sounds clear to auscultation       Cardiovascular Exercise Tolerance: Good hypertension, negative cardio ROS  Rhythm:regular Rate:Normal     Neuro/Psych negative neurological ROS  negative psych ROS   GI/Hepatic negative GI ROS, Neg liver ROS,,,  Endo/Other  negative endocrine ROS    Renal/GU Renal diseasenegative Renal ROS  negative genitourinary   Musculoskeletal   Abdominal   Peds  Hematology negative hematology ROS (+) Blood dyscrasia, anemia   Anesthesia Other Findings   Reproductive/Obstetrics negative OB ROS                             Anesthesia Physical Anesthesia Plan  ASA: 2  Anesthesia Plan: General   Post-op Pain Management:    Induction:   PONV Risk Score and Plan: Propofol infusion  Airway Management Planned:   Additional Equipment:   Intra-op Plan:   Post-operative Plan:   Informed Consent: I have reviewed the patients History and Physical, chart, labs and discussed the procedure including the risks, benefits and alternatives for the proposed anesthesia with the patient or authorized representative who has indicated his/her understanding and acceptance.     Dental Advisory Given  Plan Discussed with: CRNA  Anesthesia Plan Comments:        Anesthesia Quick Evaluation

## 2023-03-10 NOTE — Anesthesia Postprocedure Evaluation (Signed)
Anesthesia Post Note  Patient: [name]  Procedure(s) Performed: COLONOSCOPY WITH PROPOFOL POLYPECTOMY  Patient location during evaluation: Short Stay Anesthesia Type: General Level of consciousness: awake Pain management: pain level controlled Vital Signs Assessment: post-procedure vital signs reviewed and stable Respiratory status: spontaneous breathing Cardiovascular status: blood pressure returned to baseline and stable Postop Assessment: no apparent nausea or vomiting Anesthetic complications: no   No notable events documented.   Last Vitals:  Vitals:   03/10/23 0814  BP: (!) 151/68  Resp: (!) 25  Temp: 36.8 C  SpO2: 96%    Last Pain:  Vitals:   03/10/23 0917  PainSc: 0-No pain                 Kinzy Weyers

## 2023-03-10 NOTE — Addendum Note (Signed)
Addended by: Marlowe Shores on: 03/10/2023 01:53 PM   Modules accepted: Orders

## 2023-03-10 NOTE — Telephone Encounter (Signed)
No PA needed via insurance. Scheduled pt for 04/06/23 at 3 pm. Pt is to arrive one hour prior. Will send an appt letter to pt. Will give pt a call tomorrow since she just had procedure today.

## 2023-03-10 NOTE — H&P (Signed)
[name] is an 82 y.o. female.   Chief Complaint: constipation HPI: [name] is a 82 y.o. female with past medical history of kidney stones, HTN, anxiety, who comes for evaluation of persistent constipation.  Patient reports she has still presenting constipation but has not been taking MiraLAX as advised.  No melena, hematochezia, mental pain, fever or chills.  Past Medical History:  Diagnosis Date   History of kidney stones    Hypertension    Pre-diabetes     Past Surgical History:  Procedure Laterality Date   CATARACT EXTRACTION W/PHACO Left 05/02/2019   Procedure: CATARACT EXTRACTION PHACO AND INTRAOCULAR LENS PLACEMENT (IOC);  Surgeon: Fabio Pierce, MD;  Location: [facility];  Service: Ophthalmology;  Laterality: Left;  CDE: 27.19   CATARACT EXTRACTION W/PHACO Right 05/16/2019   Procedure: CATARACT EXTRACTION PHACO AND INTRAOCULAR LENS PLACEMENT RIGHT EYE;  Surgeon: Fabio Pierce, MD;  Location: [facility];  Service: Ophthalmology;  Laterality: Right;  CDE: 18.89   CESAREAN SECTION      History reviewed. No pertinent family history. Social History:  reports that she has quit smoking. She has been exposed to tobacco smoke. She has never used smokeless tobacco. She reports that she does not drink alcohol and does not use drugs.  Allergies: No Known Allergies  Medications Prior to Admission  Medication Sig Dispense Refill   ALPRAZolam (XANAX) 0.25 MG tablet Take 0.25 mg by mouth 2 (two) times daily as needed for anxiety.     amLODipine (NORVASC) 2.5 MG tablet Take 2.5 mg by mouth daily.     atorvastatin (LIPITOR) 10 MG tablet Take 10 mg by mouth daily.      carvedilol (COREG) 6.25 MG tablet Take 6.25 mg by mouth 2 (two) times daily with a meal.     Cyanocobalamin (B-12 PO) Take 1 tablet by mouth daily.     Cyanocobalamin 1000 MCG/ML KIT Inject as directed. One monthly     docusate sodium (COLACE) 100 MG capsule Take 100 mg by mouth daily.     feeding supplement, ENSURE  ENLIVE, (ENSURE ENLIVE) LIQD Take 237 mLs by mouth 2 (two) times daily between meals. 2370 mL 1   hydrALAZINE (APRESOLINE) 50 MG tablet Take 50 mg by mouth 4 (four) times daily.     losartan (COZAAR) 100 MG tablet Take 1 tablet (100 mg total) by mouth daily. 30 tablet 3   magnesium gluconate (MAGONATE) 500 MG tablet Take 500 mg by mouth daily.     memantine (NAMENDA) 5 MG tablet TAKE 1 TABLET BY MOUTH TWICE A DAY 180 tablet 1   mirtazapine (REMERON) 15 MG tablet Take 15 mg by mouth at bedtime.     ondansetron (ZOFRAN-ODT) 8 MG disintegrating tablet Take 8 mg by mouth every 8 (eight) hours as needed for nausea or vomiting.     Sodium Sulfate-Mag Sulfate-KCl (SUTAB) [phone] MG TABS As directed 24 tablet 0   acetaminophen (TYLENOL) 500 MG tablet Take 1,000 mg by mouth daily as needed for mild pain.     LORazepam (ATIVAN) 0.5 MG tablet Take 1/2 tablet 1 hour prior to MRI, can take second half 30 minutes prior if anxiety still present (Patient not taking: Reported on 01/02/2023) 1 tablet 0   ondansetron (ZOFRAN) 4 MG tablet Take 1 tablet (4 mg total) by mouth daily as needed for nausea or vomiting. (Patient not taking: Reported on 02/02/2023) 30 tablet 0   Sod Picosulfate-Mag Ox-Cit Acd (CLENPIQ) 10-3.5-12 MG-GM -GM/175ML SOLN Take 1 kit by  mouth as directed. 350 mL 0    Results for orders placed or performed during the hospital encounter of 03/10/23 (from the past 48 hour(s))  Glucose, capillary     Status: None   Collection Time: 03/10/23  8:08 AM  Result Value Ref Range   Glucose-Capillary 80 70 - 99 mg/dL    Comment: Glucose reference range applies only to samples taken after fasting for at least 8 hours.   No results found.  Review of Systems  Gastrointestinal:  Positive for constipation.  All other systems reviewed and are negative.   Blood pressure (!) 151/68, temperature 98.2 F (36.8 C), resp. rate (!) 25, height 5\' 10"  (1.778 m), weight 56.4 kg, SpO2 96%. Physical Exam   GENERAL: The patient is AO x3, in no acute distress. HEENT: Head is normocephalic and atraumatic. EOMI are intact. Mouth is well hydrated and without lesions. NECK: Supple. No masses LUNGS: Clear to auscultation. No presence of rhonchi/wheezing/rales. Adequate chest expansion HEART: RRR, normal s1 and s2. ABDOMEN: Soft, nontender, no guarding, no peritoneal signs, and nondistended. BS +. No masses. EXTREMITIES: Without any cyanosis, clubbing, rash, lesions or edema. NEUROLOGIC: AOx3, no focal motor deficit. SKIN: no jaundice, no rashes  Assessment/Plan [name] is a 82 y.o. female with past medical history of kidney stones, HTN, anxiety, who comes for evaluation of persistent constipation.  Will proceed with colonoscopy.  Dolores Frame, MD 03/10/2023, 8:22 AM

## 2023-03-10 NOTE — Op Note (Addendum)
[facility] Patient Name: [name] Procedure Date: 03/10/2023 9:10 AM MRN: [id number] Date of Birth: July 28, 1940 Attending MD: Katrinka Blazing , , [id number] CSN: [id number] Age: 82 Admit Type: Outpatient Procedure:                Colonoscopy Indications:              Rectal bleeding, Constipation Providers:                Katrinka Blazing, Angelica Ran, Dyann Ruddle, Elinor Parkinson Referring MD:              Medicines:                Monitored Anesthesia Care Complications:            No immediate complications. Estimated Blood Loss:     Estimated blood loss: none. Procedure:                Pre-Anesthesia Assessment:                           - Prior to the procedure, a History and Physical                            was performed, and patient medications, allergies                            and sensitivities were reviewed. The patient's                            tolerance of previous anesthesia was reviewed.                           - The risks and benefits of the procedure and the                            sedation options and risks were discussed with the                            patient. All questions were answered and informed                            consent was obtained.                           - ASA Grade Assessment: III - A patient with severe                            systemic disease.                           After obtaining informed consent, the colonoscope                            was passed under direct vision. Throughout the  procedure, the patient's blood pressure, pulse, and                            oxygen saturations were monitored continuously. The                            [id number]-HQ190L ([id number]) scope was introduced through                            the anus and advanced to the the cecum, identified                            by appendiceal orifice and ileocecal valve. The                             colonoscopy was performed without difficulty. The                            patient tolerated the procedure well. The quality                            of the bowel preparation was good. Scope In: 9:24:09 AM Scope Out: 9:51:59 AM Scope Withdrawal Time: 0 hours 20 minutes 3 seconds  Total Procedure Duration: 0 hours 27 minutes 50 seconds  Findings:      The perianal and digital rectal examinations were normal.      A 1 mm polyp was found in the cecum. The polyp was sessile. The polyp       was removed with a cold biopsy forceps. Resection and retrieval were       complete.      Five sessile polyps were found in the transverse colon, ascending colon       and cecum. The polyps were 3 to 8 mm in size. These polyps were removed       with a cold snare. Resection and retrieval were complete.      Three sessile polyps were found in the rectum and descending colon. The       polyps were 3 to 5 mm in size. These polyps were removed with a cold       snare. Resection and retrieval were complete.      Multiple medium-mouthed and small-mouthed diverticula were found in the       sigmoid colon, descending colon and ascending colon.      Non-bleeding internal hemorrhoids were found during retroflexion. The       hemorrhoids were small. Impression:               - One 1 mm polyp in the cecum, removed with a cold                            biopsy forceps. Resected and retrieved.                           - Five 3 to 8 mm polyps in the transverse colon, in  the ascending colon and in the cecum, removed with                            a cold snare. Resected and retrieved.                           - Three 3 to 5 mm polyps in the rectum and in the                            descending colon, removed with a cold snare.                            Resected and retrieved.                           - Diverticulosis in the sigmoid colon, in the                             descending colon and in the ascending colon.                           - Non-bleeding internal hemorrhoids. Moderate Sedation:      Per Anesthesia Care Recommendation:           - Discharge patient to home (ambulatory).                           - Resume previous diet.                           - Await pathology results.                           - Repeat colonoscopy in 3 years for surveillance.                           - Take Miralax three times a day.                           - Proceed with CT enterography to evaluate small                            bowel thickening/abnormality in MRI and weight loss. Procedure Code(s):        --- Professional ---                           [phone], Colonoscopy, flexible; with removal of                            tumor(s), polyp(s), or other lesion(s) by snare                            technique                           45380, 59,  Colonoscopy, flexible; with biopsy,                            single or multiple Diagnosis Code(s):        --- Professional ---                           D12.0, Benign neoplasm of cecum                           D12.3, Benign neoplasm of transverse colon (hepatic                            flexure or splenic flexure)                           D12.2, Benign neoplasm of ascending colon                           D12.8, Benign neoplasm of rectum                           D12.4, Benign neoplasm of descending colon                           K64.8, Other hemorrhoids                           K62.5, Hemorrhage of anus and rectum                           K59.00, Constipation, unspecified                           K57.30, Diverticulosis of large intestine without                            perforation or abscess without bleeding CPT copyright 2022 American Medical Association. All rights reserved. The codes documented in this report are preliminary and upon coder review may  be revised to meet current compliance  requirements. Katrinka Blazing, MD Katrinka Blazing,  03/10/2023 10:01:23 AM This report has been signed electronically. Number of Addenda: 0

## 2023-03-11 ENCOUNTER — Encounter (INDEPENDENT_AMBULATORY_CARE_PROVIDER_SITE_OTHER): Payer: Self-pay | Admitting: *Deleted

## 2023-03-11 LAB — SURGICAL PATHOLOGY

## 2023-03-12 ENCOUNTER — Encounter (INDEPENDENT_AMBULATORY_CARE_PROVIDER_SITE_OTHER): Payer: Self-pay | Admitting: *Deleted

## 2023-03-12 ENCOUNTER — Encounter (HOSPITAL_COMMUNITY): Payer: Self-pay | Admitting: Gastroenterology

## 2023-03-19 DIAGNOSIS — Z681 Body mass index (BMI) 19 or less, adult: Secondary | ICD-10-CM | POA: Diagnosis not present

## 2023-03-19 DIAGNOSIS — I10 Essential (primary) hypertension: Secondary | ICD-10-CM | POA: Diagnosis not present

## 2023-03-19 DIAGNOSIS — Z0001 Encounter for general adult medical examination with abnormal findings: Secondary | ICD-10-CM | POA: Diagnosis not present

## 2023-03-19 DIAGNOSIS — N183 Chronic kidney disease, stage 3 unspecified: Secondary | ICD-10-CM | POA: Diagnosis not present

## 2023-04-06 ENCOUNTER — Ambulatory Visit (HOSPITAL_COMMUNITY)
Admission: RE | Admit: 2023-04-06 | Discharge: 2023-04-06 | Disposition: A | Discharge status: Home / Self Care | Payer: Medicare Other | Source: Ambulatory Visit | Attending: Gastroenterology | Admitting: Gastroenterology

## 2023-04-06 DIAGNOSIS — N281 Cyst of kidney, acquired: Secondary | ICD-10-CM | POA: Diagnosis not present

## 2023-04-06 DIAGNOSIS — K639 Disease of intestine, unspecified: Secondary | ICD-10-CM | POA: Diagnosis not present

## 2023-04-06 DIAGNOSIS — R634 Abnormal weight loss: Secondary | ICD-10-CM | POA: Diagnosis not present

## 2023-04-06 DIAGNOSIS — R935 Abnormal findings on diagnostic imaging of other abdominal regions, including retroperitoneum: Secondary | ICD-10-CM | POA: Diagnosis not present

## 2023-04-06 DIAGNOSIS — K529 Noninfective gastroenteritis and colitis, unspecified: Secondary | ICD-10-CM | POA: Diagnosis not present

## 2023-04-06 DIAGNOSIS — K8020 Calculus of gallbladder without cholecystitis without obstruction: Secondary | ICD-10-CM | POA: Diagnosis not present

## 2023-04-06 DIAGNOSIS — K5730 Diverticulosis of large intestine without perforation or abscess without bleeding: Secondary | ICD-10-CM | POA: Diagnosis not present

## 2023-04-06 MED ORDER — IOHEXOL 300 MG/ML  SOLN
100.0000 mL | Freq: Once | INTRAMUSCULAR | Status: AC | PRN
  Administered 2023-04-06: 100 mL via INTRAVENOUS

## 2023-04-13 NOTE — Progress Notes (Signed)
Dx:  -abnormal abdominal MRI -Enteritis -unintentional weight loss

## 2023-04-14 ENCOUNTER — Telehealth (INDEPENDENT_AMBULATORY_CARE_PROVIDER_SITE_OTHER): Payer: Self-pay | Admitting: *Deleted

## 2023-04-14 NOTE — Telephone Encounter (Signed)
Pt's granddaughter ( on dpr ) [name] called to get ct results. I called her back and let her know radiologist has not read yet. She states her constipation is getting worse. She went a week with no BM and she is taking miralax 1 cap tid and colace 100mg  2 every day. Since no BM in one week she gave her an exlax and she had a BM that was very painful. Having pain in bottom of abdomen and feeling bloated, states she is not eating much and has loss of appetite when she gets this way.   [phone]

## 2023-04-15 ENCOUNTER — Other Ambulatory Visit: Payer: Self-pay | Admitting: Gastroenterology

## 2023-04-15 DIAGNOSIS — K5909 Other constipation: Secondary | ICD-10-CM

## 2023-04-15 DIAGNOSIS — K5904 Chronic idiopathic constipation: Secondary | ICD-10-CM

## 2023-04-15 MED ORDER — LINACLOTIDE 145 MCG PO CAPS
145.0000 ug | ORAL_CAPSULE | Freq: Every day | ORAL | 1 refills | Status: AC
Start: 2023-04-15 — End: ?

## 2023-04-15 NOTE — Telephone Encounter (Signed)
Please let her know we have not received the imaging results yet.  We will reach her back once the imaging has been read.  I will send Linzess 145 mcg every day to her pharmacy which she will work to improve her constipation.

## 2023-04-16 NOTE — Telephone Encounter (Signed)
Left message to return call on pt's mobile number since unable to leave message on preferred number.

## 2023-04-16 NOTE — Telephone Encounter (Signed)
Tried to call no answer

## 2023-04-16 NOTE — Telephone Encounter (Signed)
Tried to call no answer vm is full and unable to leave a message

## 2023-04-20 ENCOUNTER — Encounter (INDEPENDENT_AMBULATORY_CARE_PROVIDER_SITE_OTHER): Payer: Self-pay

## 2023-04-20 DIAGNOSIS — I10 Essential (primary) hypertension: Secondary | ICD-10-CM | POA: Diagnosis not present

## 2023-04-20 DIAGNOSIS — N183 Chronic kidney disease, stage 3 unspecified: Secondary | ICD-10-CM | POA: Diagnosis not present

## 2023-04-20 DIAGNOSIS — I129 Hypertensive chronic kidney disease with stage 1 through stage 4 chronic kidney disease, or unspecified chronic kidney disease: Secondary | ICD-10-CM | POA: Diagnosis not present

## 2023-04-20 NOTE — Telephone Encounter (Signed)
Pt's granddaughter [name] was notified.

## 2023-04-20 NOTE — Progress Notes (Signed)
My Chart Message sent to the patient.  the CT enterography did not show any abnormalities in her bowels? The abnormalities seen in the MRI have resolved. Has gallstones, but this does not explain her symptoms.  Chelsea, she will follow with you in clinic soon.

## 2023-04-23 ENCOUNTER — Ambulatory Visit (INDEPENDENT_AMBULATORY_CARE_PROVIDER_SITE_OTHER): Payer: Medicare Other | Admitting: Gastroenterology

## 2023-06-20 DIAGNOSIS — I10 Essential (primary) hypertension: Secondary | ICD-10-CM | POA: Diagnosis not present

## 2023-06-20 DIAGNOSIS — N183 Chronic kidney disease, stage 3 unspecified: Secondary | ICD-10-CM | POA: Diagnosis not present

## 2023-06-20 DIAGNOSIS — I129 Hypertensive chronic kidney disease with stage 1 through stage 4 chronic kidney disease, or unspecified chronic kidney disease: Secondary | ICD-10-CM | POA: Diagnosis not present

## 2023-07-21 DIAGNOSIS — I10 Essential (primary) hypertension: Secondary | ICD-10-CM | POA: Diagnosis not present

## 2023-07-21 DIAGNOSIS — I129 Hypertensive chronic kidney disease with stage 1 through stage 4 chronic kidney disease, or unspecified chronic kidney disease: Secondary | ICD-10-CM | POA: Diagnosis not present

## 2023-07-21 DIAGNOSIS — N183 Chronic kidney disease, stage 3 unspecified: Secondary | ICD-10-CM | POA: Diagnosis not present

## 2023-07-21 DIAGNOSIS — E782 Mixed hyperlipidemia: Secondary | ICD-10-CM | POA: Diagnosis not present

## 2023-08-10 ENCOUNTER — Telehealth: Payer: Self-pay | Admitting: Adult Health

## 2023-08-10 NOTE — Telephone Encounter (Signed)
 MYC confirmation

## 2023-08-10 NOTE — Progress Notes (Deleted)
[facility] [address] [place]. [place] [address] [phone]       OFFICE FOLLOW UP NOTE  Ms. [name] Date of Birth:  [birth year] Medical Record Number:  [id number]    Primary neurologist: Dr. Vickey Huger Reason for visit: Delirium, memory loss complaints    SUBJECTIVE:  CHIEF COMPLAINT:  No chief complaint on file.   HPI:   Update 08/11/2023 JM: Patient returns for follow-up visit.  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  She is scheduled for evaluation with Dr. Kieth Brightly on 12/21/2023.  Remains on Namenda 5 mg twice daily, tolerating without side effects.       Consult visit 01/28/2023 Dr. Vickey Huger: [name] is a 83 y.o. female and seen here upon referral from Dr. Ocie Doyne D. Sherryll Burger ,DO for a Consultation/ Evaluation of Confusion and Agitation. She was aggressive towards her granddaughter while in hospital. She felt paranoid. Sundowning. Active at night.  She is forgetting to eat and drink. Loss of appetite.      Her grand daughter has been staying over night for the last weeks, the patient lives with her husband in a private home. One dog. She has no set routines for meal times.  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  This patient was referred to neurology by ED  before she was seeing her PCP, Dr Sherwood Gambler.  This patient herself reports onset of memory loss over a period of 2-4 months. She doesn't drive anymore after passing the exit- that was 6 years ago.     There have been many ED visits 5-24-for urinary frequency.  12-26-2022 ED visit for constipation. There was a mass found in her intestines, she has a colonoscopy scheduled.   6-13-- 14th ED vsit with weakness. [name] is a 83 y.o. female with medical history significant for hypertension and recurrent kidney stones who presented to the ED due to complaints of weakness and malaise as well as decreased  appetite and persistent nausea with dry heaving.  She is also noted to have some confusion per son at bedside.  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  She has followed up with GI on 6/11 with plans to have MRI.  She continues to have some nausea with dry heaving and has not taken her home blood pressure medications in the last several days.  She denies any diarrhea, fevers, or chills.  She has notably had a 20 pound weight loss that was unintentional over the past year and also has some trouble with constipation.    She had another ED visit on 01-19-2023,  seriously high BP, systolic BP over 191, unable to move" again called " weakness", but ended up for confusion, delirium, dementia (?) ' She was admitted for evaluation of generalized weakness in the setting of failure to thrive as well as some uncontrolled hypertension. She appears to have some progression of her dementia and was seen by palliative care and is now DNR and will be referred to outpatient palliative evaluation. Patient and granddaughter are concerned about dementia and would like referral to neurology which has been arranged for further evaluation and management. It appears that her long-term prognosis is poor as she continues to have weight loss with poor appetite. She  continues to remain at high risk for readmissions. No other acute events or concerns noted throughout the course of the stay.    I noted her MRI showed atrophy, white matter disease and shrunken hippocampus area. She has had uncontrolled  HTN.  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 Ascites.     ROS:   14 system review of systems performed and negative with exception of ***  PMH:  Past Medical History:  Diagnosis Date   History of kidney stones     Hypertension    Pre-diabetes     PSH:  Past Surgical History:  Procedure Laterality Date   CATARACT EXTRACTION W/PHACO Left 05/02/2019   Procedure: CATARACT EXTRACTION PHACO AND INTRAOCULAR LENS PLACEMENT (IOC);  Surgeon: Fabio Pierce, MD;  Location: [facility];  Service: Ophthalmology;  Laterality: Left;  CDE: 27.19   CATARACT EXTRACTION W/PHACO Right 05/16/2019   Procedure: CATARACT EXTRACTION PHACO AND INTRAOCULAR LENS PLACEMENT RIGHT EYE;  Surgeon: Fabio Pierce, MD;  Location: [facility];  Service: Ophthalmology;  Laterality: Right;  CDE: 18.89   CESAREAN SECTION     COLONOSCOPY WITH PROPOFOL N/A 03/10/2023   Procedure: COLONOSCOPY WITH PROPOFOL;  Surgeon: Dolores Frame, MD;  Location: [facility];  Service: Gastroenterology;  Laterality: N/A;  2:00PM;ASA 3   POLYPECTOMY  03/10/2023   Procedure: POLYPECTOMY;  Surgeon: Marguerita Merles, Reuel Boom, MD;  Location: [facility];  Service: Gastroenterology;;    Social History:  Social History   Socioeconomic History   Marital status: Married    Spouse name: Not on file   Number of children: Not on file   Years of education: Not on file   Highest education level: Not on file  Occupational History   Not on file  Tobacco Use   Smoking status: Former    Passive exposure: Past   Smokeless tobacco: Never  Vaping Use   Vaping status: Not on file  Substance and Sexual Activity   Alcohol use: No   Drug use: Never   Sexual activity: Not on file  Other Topics Concern   Not on file  Social History Narrative   Not on file   Social Drivers of Health   Financial Resource Strain: Not on file  Food Insecurity: No Food Insecurity (01/19/2023)   Hunger Vital Sign    Worried About Running Out of Food in the Last Year: Never true    Ran Out of Food in the Last Year: Never true  Transportation Needs: No Transportation Needs (01/19/2023)   PRAPARE - Administrator, Civil Service (Medical): No    Lack of Transportation  (Non-Medical): No  Physical Activity: Not on file  Stress: Not on file  Social Connections: Not on file  Intimate Partner Violence: Not At Risk (01/19/2023)   Humiliation, Afraid, Rape, and Kick questionnaire    Fear of Current or Ex-Partner: No    Emotionally Abused: No    Physically Abused: No    Sexually Abused: No    Family History: No family history on file.  Medications:   Current Outpatient Medications on File Prior to Visit  Medication Sig Dispense Refill   acetaminophen (TYLENOL) 500 MG tablet Take 1,000 mg by mouth daily as needed for mild pain.     ALPRAZolam (XANAX) 0.25 MG tablet Take 0.25 mg by mouth 2 (two) times daily as needed for anxiety.     amLODipine (NORVASC) 2.5 MG tablet Take 2.5  mg by mouth daily.     atorvastatin (LIPITOR) 10 MG tablet Take 10 mg by mouth daily.      carvedilol (COREG) 6.25 MG tablet Take 6.25 mg by mouth 2 (two) times daily with a meal.     Cyanocobalamin (B-12 PO) Take 1 tablet by mouth daily.     Cyanocobalamin 1000 MCG/ML KIT Inject as directed. One monthly     docusate sodium (COLACE) 100 MG capsule Take 100 mg by mouth daily.     feeding supplement, ENSURE ENLIVE, (ENSURE ENLIVE) LIQD Take 237 mLs by mouth 2 (two) times daily between meals. 2370 mL 1   hydrALAZINE (APRESOLINE) 50 MG tablet Take 50 mg by mouth 4 (four) times daily.     linaclotide (LINZESS) 145 MCG CAPS capsule Take 1 capsule (145 mcg total) by mouth daily. 90 capsule 1   LORazepam (ATIVAN) 0.5 MG tablet Take 1/2 tablet 1 hour prior to MRI, can take second half 30 minutes prior if anxiety still present (Patient not taking: Reported on 01/02/2023) 1 tablet 0   losartan (COZAAR) 100 MG tablet Take 1 tablet (100 mg total) by mouth daily. 30 tablet 3   magnesium gluconate (MAGONATE) 500 MG tablet Take 500 mg by mouth daily.     memantine (NAMENDA) 5 MG tablet TAKE 1 TABLET BY MOUTH TWICE A DAY 180 tablet 1   mirtazapine (REMERON) 15 MG tablet Take 15 mg by mouth at bedtime.      ondansetron (ZOFRAN) 4 MG tablet Take 1 tablet (4 mg total) by mouth daily as needed for nausea or vomiting. (Patient not taking: Reported on 02/02/2023) 30 tablet 0   ondansetron (ZOFRAN-ODT) 8 MG disintegrating tablet Take 8 mg by mouth every 8 (eight) hours as needed for nausea or vomiting.     Sod Picosulfate-Mag Ox-Cit Acd (CLENPIQ) 10-3.5-12 MG-GM -GM/175ML SOLN Take 1 kit by mouth as directed. 350 mL 0   No current facility-administered medications on file prior to visit.    Allergies:  No Known Allergies    OBJECTIVE:  Physical Exam  There were no vitals filed for this visit. There is no height or weight on file to calculate BMI. No results found.      No data to display           General: well developed, well nourished, seated, in no evident distress Head: head normocephalic and atraumatic.   Neck: supple with no carotid or supraclavicular bruits Cardiovascular: regular rate and rhythm, no murmurs Musculoskeletal: no deformity Skin:  no rash/petichiae Vascular:  Normal pulses all extremities   Neurologic Exam Mental Status: Awake and fully alert. Oriented to place and time. Recent and remote memory intact. Attention span, concentration and fund of knowledge appropriate. Mood and affect appropriate.  Cranial Nerves: Pupils equal, briskly reactive to light. Extraocular movements full without nystagmus. Visual fields full to confrontation. Hearing intact. Facial sensation intact. Face, tongue, palate moves normally and symmetrically.  Motor: Normal bulk and tone. Normal strength in all tested extremity muscles Sensory.: intact to touch , pinprick , position and vibratory sensation.  Coordination: Rapid alternating movements normal in all extremities. Finger-to-nose and heel-to-shin performed accurately bilaterally. Gait and Station: Arises from chair without difficulty. Stance is normal. Gait demonstrates normal stride length and balance with ***. Tandem walk and heel  toe ***.  Reflexes: 1+ and symmetric. Toes downgoing.      01/28/2023   11:15 AM  MMSE - Mini Mental State Exam  Orientation to time 5  Orientation to Place 5  Registration 3  Attention/ Calculation 1  Recall 0  Language- name 2 objects 2  Language- repeat 1  Language- follow 3 step command 3  Language- read & follow direction 1  Write a sentence 1  Copy design 0  Total score 22         ASSESSMENT/PLAN: [name] is a 83 y.o. year old female with episode of delirium in 12/2022 during hospitalization and progressive memory difficulties since around 09/2022.    Memory loss:  MMSE ***/30 (prior 22/30) Continue memantine *** Evaluation with Dr. Kieth Brightly scheduled 12/2023 ATN profile consistent with Alzheimer's related pathology     Follow up in *** or call earlier if needed   CC:  PCP: Elfredia Nevins, MD    I spent *** minutes of face-to-face and non-face-to-face time with patient.  This included previsit chart review, lab review, study review, order entry, electronic health record documentation, patient education and discussion regarding above diagnoses and treatment plan and answered all other questions to patient's satisfaction    Ihor Austin, [facility]  [facility] [address] [place], [place] [address]  Phone [phone] Fax [phone] Note: This document was prepared with digital dictation and possible smart phrase technology. Any transcriptional errors that result from this process are unintentional.

## 2023-08-11 ENCOUNTER — Ambulatory Visit: Payer: Medicare Other | Admitting: Adult Health

## 2023-08-11 ENCOUNTER — Encounter: Payer: Self-pay | Admitting: Adult Health

## 2023-08-21 ENCOUNTER — Other Ambulatory Visit: Payer: Self-pay | Admitting: Neurology

## 2023-08-21 DIAGNOSIS — E782 Mixed hyperlipidemia: Secondary | ICD-10-CM | POA: Diagnosis not present

## 2023-08-21 DIAGNOSIS — I129 Hypertensive chronic kidney disease with stage 1 through stage 4 chronic kidney disease, or unspecified chronic kidney disease: Secondary | ICD-10-CM | POA: Diagnosis not present

## 2023-08-21 DIAGNOSIS — E039 Hypothyroidism, unspecified: Secondary | ICD-10-CM | POA: Diagnosis not present

## 2023-08-21 NOTE — Telephone Encounter (Signed)
Last seen on 01/28/23 No 4-6 month follow up scheduled

## 2023-11-16 ENCOUNTER — Other Ambulatory Visit: Payer: Self-pay | Admitting: Neurology

## 2023-11-16 NOTE — Telephone Encounter (Signed)
 Last seen on 01/28/23 No follow RV in 4-6 month scheduled

## 2023-11-25 ENCOUNTER — Other Ambulatory Visit: Payer: Self-pay | Admitting: Neurology

## 2023-11-26 DIAGNOSIS — I129 Hypertensive chronic kidney disease with stage 1 through stage 4 chronic kidney disease, or unspecified chronic kidney disease: Secondary | ICD-10-CM | POA: Diagnosis not present

## 2023-11-26 DIAGNOSIS — Z6824 Body mass index (BMI) 24.0-24.9, adult: Secondary | ICD-10-CM | POA: Diagnosis not present

## 2023-11-26 DIAGNOSIS — N183 Chronic kidney disease, stage 3 unspecified: Secondary | ICD-10-CM | POA: Diagnosis not present

## 2023-11-26 DIAGNOSIS — I10 Essential (primary) hypertension: Secondary | ICD-10-CM | POA: Diagnosis not present

## 2023-12-21 ENCOUNTER — Encounter: Payer: Medicare Other | Attending: Psychology | Admitting: Psychology

## 2024-03-09 ENCOUNTER — Inpatient Hospital Stay (HOSPITAL_COMMUNITY): Admit: 2024-03-09 | Payer: Medicare Other

## 2024-05-04 ENCOUNTER — Encounter (INDEPENDENT_AMBULATORY_CARE_PROVIDER_SITE_OTHER): Payer: Self-pay | Admitting: Gastroenterology
